# Patient Record
Sex: Male | Born: 1935 | Race: Black or African American | Hispanic: No | Marital: Married | Smoking: Former smoker
Health system: Southern US, Community
[De-identification: ages and names within clinical notes are randomized; demographics above are authoritative.]

## PROBLEM LIST (undated history)

## (undated) DIAGNOSIS — I639 Cerebral infarction, unspecified: Secondary | ICD-10-CM

## (undated) DIAGNOSIS — H269 Unspecified cataract: Secondary | ICD-10-CM

## (undated) DIAGNOSIS — I10 Essential (primary) hypertension: Secondary | ICD-10-CM

## (undated) HISTORY — DX: Essential (primary) hypertension: I10

## (undated) HISTORY — DX: Unspecified cataract: H26.9

## (undated) HISTORY — DX: Cerebral infarction, unspecified: I63.9

## (undated) HISTORY — PX: EYE SURGERY: SHX253

---

## 2012-04-29 ENCOUNTER — Ambulatory Visit: Payer: Self-pay | Admitting: Family Medicine

## 2012-04-29 VITALS — BP 160/68 | HR 67 | Temp 97.2°F | Resp 18 | Ht 71.5 in | Wt 246.2 lb

## 2012-04-29 DIAGNOSIS — I10 Essential (primary) hypertension: Secondary | ICD-10-CM

## 2012-04-29 DIAGNOSIS — I509 Heart failure, unspecified: Secondary | ICD-10-CM

## 2012-04-29 LAB — COMPREHENSIVE METABOLIC PANEL
Albumin: 4.1 g/dL (ref 3.5–5.2)
Alkaline Phosphatase: 75 U/L (ref 39–117)
BUN: 10 mg/dL (ref 6–23)
CO2: 30 mEq/L (ref 19–32)
Calcium: 8.8 mg/dL (ref 8.4–10.5)
Chloride: 103 mEq/L (ref 96–112)
Glucose, Bld: 153 mg/dL — ABNORMAL HIGH (ref 70–99)
Potassium: 4 mEq/L (ref 3.5–5.3)
Sodium: 140 mEq/L (ref 135–145)
Total Protein: 6.9 g/dL (ref 6.0–8.3)

## 2012-04-29 MED ORDER — FUROSEMIDE 40 MG PO TABS: 40.0000 mg | ORAL_TABLET | Freq: Two times a day (BID) | ORAL | Status: DC

## 2012-04-29 MED ORDER — DIGOXIN 125 MCG PO TABS: 125.0000 ug | ORAL_TABLET | Freq: Every day | ORAL | Status: DC

## 2012-04-29 MED ORDER — SPIRONOLACTONE 25 MG PO TABS: 25.0000 mg | ORAL_TABLET | Freq: Every day | ORAL | Status: DC

## 2012-04-29 MED ORDER — LOSARTAN POTASSIUM 50 MG PO TABS: 50.0000 mg | ORAL_TABLET | Freq: Every day | ORAL | Status: DC

## 2012-04-29 MED ORDER — CARVEDILOL 12.5 MG PO TABS: 12.5000 mg | ORAL_TABLET | Freq: Two times a day (BID) | ORAL | Status: DC

## 2012-04-29 NOTE — Progress Notes (Signed)
  Subjective:    Patient ID: Barry Owens, male    DOB: 04/13/1931, 78 y.o.   MRN: [id number]  HPI 78 yo male, new patient, here for medication refills.  Also states has edema in his feet.  He is here visiting daughter from [place].  Here until August.  Runs out of all his medications today.  Arrived in February.  Had 3 month supply from doctor in [place].   Has always had swelling in feet.  Takes Lasix for this.  Comes and goes.     Review of Systems Negative except as per HPI     Objective:   Physical Exam  Constitutional: He appears well-developed and well-nourished.  Cardiovascular: Normal rate, regular rhythm, normal heart sounds and intact distal pulses.   No murmur heard. Pulmonary/Chest: Effort normal and breath sounds normal.  Neurological: He is alert.  Skin: Skin is warm and dry.   2+ edema in feet and ankles       Assessment & Plan:  CHF and HTN - Appears stable here.  States hadn't had bloodwork checked in awhile.  Will check dig level and CMET.

## 2015-03-22 ENCOUNTER — Other Ambulatory Visit: Payer: Self-pay | Admitting: Family Medicine

## 2015-03-22 ENCOUNTER — Ambulatory Visit (INDEPENDENT_AMBULATORY_CARE_PROVIDER_SITE_OTHER): Payer: Self-pay

## 2015-03-22 ENCOUNTER — Ambulatory Visit (INDEPENDENT_AMBULATORY_CARE_PROVIDER_SITE_OTHER): Payer: Self-pay | Admitting: Family Medicine

## 2015-03-22 VITALS — BP 150/70 | HR 70 | Temp 98.2°F | Resp 18 | Ht 71.0 in | Wt 228.2 lb

## 2015-03-22 DIAGNOSIS — M25561 Pain in right knee: Secondary | ICD-10-CM

## 2015-03-22 DIAGNOSIS — R21 Rash and other nonspecific skin eruption: Secondary | ICD-10-CM

## 2015-03-22 DIAGNOSIS — R600 Localized edema: Secondary | ICD-10-CM

## 2015-03-22 DIAGNOSIS — M25562 Pain in left knee: Secondary | ICD-10-CM

## 2015-03-22 MED ORDER — FUROSEMIDE 20 MG PO TABS: 20.0000 mg | ORAL_TABLET | Freq: Every day | ORAL | Status: DC

## 2015-03-22 MED ORDER — CLOTRIMAZOLE-BETAMETHASONE 1-0.05 % EX CREA: 1.0000 "application " | TOPICAL_CREAM | Freq: Two times a day (BID) | CUTANEOUS | Status: DC

## 2015-03-22 NOTE — Progress Notes (Addendum)
Patient ID: Barry Owens, male   DOB: 12/05/1931, 81 y.o.   MRN: [id number]   This chart was scribed for Elvina SidleKurt Lauenstein, MD by [facility] Abu Hashem, medical scribe at [facility].The patient was seen in exam room 02 and the patient's care was started at 3:13 PM.  Patient ID: Barry Owens MRN: [id number], DOB: 12/05/1931, 81 y.o. Date of Encounter: 03/22/2015  Primary Physician: Tally DueGUEST, CHRIS WARREN, MD  Chief Complaint:  Chief Complaint  Patient presents with   Knee Pain    bilateral knee pain worse x 2-3 months. (chronic x 5 years)   HPI:  Barry Owens is a [age] y.o. male who presents to [facility] complaining of chronic bilateral knee pain for 5 years. Over the past 2-3 months his right knee has gradually worsening. Pain worsens when sitting down and walking. Pt wakes with an abnormal gait due to his knee pain. Both legs are swollen. He denies a rash.  He also has rash on his right thigh for one year now.  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 He is visiting his daughter.   No past medical history on file.   Home Meds: Prior to Admission medications   Not on File   Allergies: No Known Allergies  History   Social History   Marital Status: Married    Spouse Name: N/A   Number of Children: N/A   Years of Education: N/A   Occupational History   Not on file.   Social History Main Topics   Smoking status: Former Smoker    Types: Cigarettes    Quit date: 04/30/1987   Smokeless tobacco: Not on file   Alcohol Use: Not on file   Drug Use: Not on file   Sexual Activity: Not on file   Other Topics Concern   Not on file   Social History Narrative    Review of Systems: Constitutional: negative for chills, fever, night sweats, weight changes, or fatigue  HEENT: negative for vision changes, hearing loss, congestion, rhinorrhea, ST, epistaxis, or sinus pressure Cardiovascular: negative for chest  pain or palpitations,  Respiratory: negative for hemoptysis, wheezing, shortness of breath, or cough Abdominal: negative for abdominal pain, nausea, vomiting, diarrhea, or constipation Dermatological: Positive for a rash Msk: Positive for arthralgias, leg swelling. Neurologic: negative for headache, dizziness, or syncope All other systems reviewed and are otherwise negative with the exception to those above and in the HPI.  Physical Exam: Blood pressure 150/70, pulse 70, temperature 98.2 F (36.8 C), temperature source Oral, height 5\' 11"  (1.803 m), weight 228 lb 4 oz (103.534 kg), SpO2 99 %., Body mass index is 31.85 kg/(m^2). General: Well developed, well nourished, in no acute distress. Head: Normocephalic, atraumatic, eyes without discharge, sclera non-icteric, nares are without discharge. Bilateral auditory canals clear, TM's are without perforation, pearly grey and translucent with reflective cone of light bilaterally. Oral cavity moist, posterior pharynx without exudate, erythema, peritonsillar abscess, or post nasal drip.  Neck: Supple. No thyromegaly. Full ROM. No lymphadenopathy. Lungs: Clear bilaterally to auscultation without wheezes, rales, or rhonchi. Breathing is unlabored. Heart: RRR with S1 S2. No murmurs, rubs, or gallops appreciated. Abdomen: Soft, non-tender, non-distended with normoactive bowel sounds. No hepatomegaly. No rebound/guarding. No obvious abdominal masses. Msk:  Strength and tone normal for age. Right knee is slightly tender with no effusion that has crepitus.  Extremities/Skin: Warm and dry. No clubbing or cyanosis. +3 edema  in the feet, ankles and 2/3rd up his legs. About a 15 cm hyperpigmented scaly, thickened on the right anterior with circumferential scaling. Neuro: Alert and oriented X 3. Moves all extremities spontaneously. Gait is normal. CNII-XII grossly in tact. Psych:  Responds to questions appropriately with a normal affect.  After sterile prep and  ethyl chloride anesthesia, the lateral joint line approach to the knee was used to inject 80 mg of Depo-Medrol and 1 mL of Marcaine into the joint cavity. No complications and patient experienced significant relief after the injection.   [facility] reading (PRIMARY) by  Dr. Milus Glazier right knee film shows linear calcification in the medial meniscus and degenerative changes throughout the knee..    ASSESSMENT AND PLAN:  81 y.o. year old male with rash on right thigh which is typical for tinea corporis, persistent knee pain which has been going on for months and getting worse associated with crepitus, and pedal edema with postphlebitic changes of his Lasix (hyperpigmentation).  Patient may return for follow-up injection and the other knee depending on how long this lasts for the right knee.  This chart was scribed in my presence and reviewed by me personally.    ICD-9-CM ICD-10-CM   1. Knee pain, right 719.46 M25.561 CANCELED: DG Knee Complete 4 Views Right  2. Pedal edema 782.3 R60.0 furosemide (LASIX) 20 MG tablet  3. Rash and nonspecific skin eruption 782.1 R21 clotrimazole-betamethasone (LOTRISONE) cream  4. Knee pain, acute, left 719.46 M25.562      Signed, Elvina Sidle, MD    Signed, Elvina Sidle, MD 03/22/2015 3:11 PM

## 2015-03-22 NOTE — Patient Instructions (Signed)
Pseudogout Pseudogout is similar to gout. It is an arthritis that causes pain, swelling, and inflammation in a joint. This is due to the presence of calcium pyrophosphate crystals in the joint fluid. This is a different type of crystal than the crystals that cause gout. The joint pain can be severe and may last for days. In some cases, it may last much longer and can mimic rheumatoid arthritis or osteoarthritis. CAUSES  The exact cause of the disease is not known. It develops when crystals of calcium pyrophosphate build up in a joint. These crystals cause inflammation that leads to pain and swelling of the joint.  Chances of developing pseudogout increase with age. It often follows a minor injury.  The condition may be passed down from parent to child (hereditary).  Events such as strokes, heart attacks, or surgery may increase the risk of pseudogout.  Pseudogout can be associated with other disorders (hemophilia, ochronosis, amyloidosis, or hormonal disorders).  Pseudogout can be associated with dehydration, especially following surgery or hospitalization. Patients with known pseudogout should stay well hydrated before and after surgery. SYMPTOMS   Intense, constant pain in one joint that seems to come on for no reason.  The joint area may be hot to the touch, red, swollen, and stiff.  Pain may last from several days to a few weeks. It may then disappear. Later, it may start again, possibly in a different joint.  Pseudogout usually affects the knees. It can also affect the wrists, elbows, shoulders, and ankles. DIAGNOSIS  The diagnosis is often suggested by your exam or is suspected when an abnormal buildup of calcium salts (calcifications) are seen in the cartilage of joints on X-rays. The final diagnosis is made when fluid from the joint is examined under a special microscope used to find calcium pyrophosphate crystals. The crystals of pseudogout and gout may both be present at the same  time. TREATMENT  Nonsteroidal anti-inflammatory drugs (NSAIDs), such as naproxen, treat the pain. Identifying the trigger of pseudogout and treating the underlying cause, such as dehydration, is also important. There is no way to remove the crystals themselves. HOME CARE INSTRUCTIONS   Put ice on the sore joint.  Put ice in a plastic bag.  Place a towel between your skin and the bag.  Leave the ice on for 15-20 minutes at a time, 03-04 times a day, for the first 2 days.  Keep your affected joints raised (elevated) when possible to lessen swelling.  Use crutches (non-weight bearing) as needed. Walk without crutches as the pain allows, or as instructed. Gradually, start bearing weight.  Only take over-the-counter or prescription medicines for pain, discomfort, or fever as directed by your caregiver.  Once you recover from the painful attack, exercise regularly to keep your muscle strength. Not using a sore joint will cause the muscles around it to become weak. This may increase pain. Low-impact exercises, such as swimming, bicycling, water aerobics, and walking, may be best. This will give you energy, strengthen your heart, help you control your weight, and improve your well-being.  Maintain a healthy weight so your joints do not need to bear more weight than necessary. SEEK MEDICAL CARE IF:   You have an increase in joint pain not relieved with medicine.  You have a fever.  You have more serious symptoms such as skin rash, diarrhea, vomiting, headache, or other joint pains. FOR MORE INFORMATION  Arthritis Foundation: www.arthritis.org National Institute of Arthritis and Musculoskeletal and Skin Diseases: www.niams.nih.gov Document Released: 08/14/2004   Document Revised: 02/14/2012 Document Reviewed: 03/06/2010 Exit[employer] Patient Information 2015 [employer]. This information is not intended to replace advice given to you by your health care provider. Make sure you discuss any  questions you have with your health care provider.  

## 2015-08-16 ENCOUNTER — Emergency Department (HOSPITAL_COMMUNITY): Payer: Medicaid Other

## 2015-08-16 ENCOUNTER — Encounter (HOSPITAL_COMMUNITY): Payer: Self-pay | Admitting: *Deleted

## 2015-08-16 ENCOUNTER — Inpatient Hospital Stay (HOSPITAL_COMMUNITY)
Admission: EM | Admit: 2015-08-16 | Discharge: 2015-08-19 | DRG: 066 | Disposition: A | Discharge status: Home Health | Payer: Medicaid Other | Attending: Neurology | Admitting: Neurology

## 2015-08-16 DIAGNOSIS — F0390 Unspecified dementia without behavioral disturbance: Secondary | ICD-10-CM | POA: Diagnosis present

## 2015-08-16 DIAGNOSIS — R4182 Altered mental status, unspecified: Secondary | ICD-10-CM | POA: Diagnosis present

## 2015-08-16 DIAGNOSIS — I639 Cerebral infarction, unspecified: Secondary | ICD-10-CM

## 2015-08-16 DIAGNOSIS — E785 Hyperlipidemia, unspecified: Secondary | ICD-10-CM | POA: Diagnosis present

## 2015-08-16 DIAGNOSIS — H5347 Heteronymous bilateral field defects: Secondary | ICD-10-CM | POA: Diagnosis present

## 2015-08-16 DIAGNOSIS — I619 Nontraumatic intracerebral hemorrhage, unspecified: Secondary | ICD-10-CM | POA: Diagnosis present

## 2015-08-16 DIAGNOSIS — I10 Essential (primary) hypertension: Secondary | ICD-10-CM | POA: Diagnosis present

## 2015-08-16 DIAGNOSIS — I611 Nontraumatic intracerebral hemorrhage in hemisphere, cortical: Principal | ICD-10-CM | POA: Diagnosis present

## 2015-08-16 DIAGNOSIS — Z8249 Family history of ischemic heart disease and other diseases of the circulatory system: Secondary | ICD-10-CM | POA: Diagnosis not present

## 2015-08-16 DIAGNOSIS — Z87891 Personal history of nicotine dependence: Secondary | ICD-10-CM

## 2015-08-16 LAB — COMPREHENSIVE METABOLIC PANEL
ALK PHOS: 57 U/L (ref 38–126)
ALT: 13 U/L — ABNORMAL LOW (ref 17–63)
ANION GAP: 8 (ref 5–15)
AST: 34 U/L (ref 15–41)
Albumin: 3.8 g/dL (ref 3.5–5.0)
BILIRUBIN TOTAL: 1.2 mg/dL (ref 0.3–1.2)
BUN: 10 mg/dL (ref 6–20)
CALCIUM: 8.7 mg/dL — AB (ref 8.9–10.3)
CO2: 23 mmol/L (ref 22–32)
CREATININE: 1.27 mg/dL — AB (ref 0.61–1.24)
Chloride: 106 mmol/L (ref 101–111)
GFR, EST AFRICAN AMERICAN: 59 mL/min — AB (ref >60)
GFR, EST NON AFRICAN AMERICAN: 51 mL/min — AB (ref >60)
Glucose, Bld: 135 mg/dL — ABNORMAL HIGH (ref 65–99)
Potassium: 4 mmol/L (ref 3.5–5.1)
SODIUM: 137 mmol/L (ref 135–145)
TOTAL PROTEIN: 7.1 g/dL (ref 6.5–8.1)

## 2015-08-16 LAB — CBC
HCT: 40.5 % (ref 39.0–52.0)
HEMOGLOBIN: 13.1 g/dL (ref 13.0–17.0)
MCH: 27.1 pg (ref 26.0–34.0)
MCHC: 32.3 g/dL (ref 30.0–36.0)
MCV: 83.9 fL (ref 78.0–100.0)
PLATELETS: 188 10*3/uL (ref 150–400)
RBC: 4.83 MIL/uL (ref 4.22–5.81)
RDW: 14 % (ref 11.5–15.5)
WBC: 5.1 10*3/uL (ref 4.0–10.5)

## 2015-08-16 LAB — CBG MONITORING, ED
GLUCOSE-CAPILLARY: 95 mg/dL (ref 65–99)
Glucose-Capillary: 174 mg/dL — ABNORMAL HIGH (ref 65–99)

## 2015-08-16 LAB — MRSA PCR SCREENING: MRSA by PCR: NEGATIVE

## 2015-08-16 MED ORDER — STROKE: EARLY STAGES OF RECOVERY BOOK
Freq: Once | Status: AC
  Administered 2015-08-16: 21:00:00
  Filled 2015-08-16: qty 1

## 2015-08-16 MED ORDER — SENNOSIDES-DOCUSATE SODIUM 8.6-50 MG PO TABS
1.0000 | ORAL_TABLET | Freq: Two times a day (BID) | ORAL | Status: DC
  Administered 2015-08-16 – 2015-08-19 (×5): 1 via ORAL
  Filled 2015-08-16 (×7): qty 1

## 2015-08-16 MED ORDER — ACETAMINOPHEN 325 MG PO TABS
650.0000 mg | ORAL_TABLET | ORAL | Status: DC | PRN
  Administered 2015-08-19: 650 mg via ORAL
  Filled 2015-08-16: qty 2

## 2015-08-16 MED ORDER — STROKE: EARLY STAGES OF RECOVERY BOOK
Freq: Once | Status: AC
  Administered 2015-08-16: 22:00:00
  Filled 2015-08-16: qty 1

## 2015-08-16 MED ORDER — SODIUM CHLORIDE 0.9 % IV BOLUS (SEPSIS)
1000.0000 mL | Freq: Once | INTRAVENOUS | Status: AC
  Administered 2015-08-16: 1000 mL via INTRAVENOUS

## 2015-08-16 MED ORDER — LABETALOL HCL 5 MG/ML IV SOLN
10.0000 mg | INTRAVENOUS | Status: DC | PRN
  Filled 2015-08-16: qty 4

## 2015-08-16 MED ORDER — NICARDIPINE HCL IN NACL 20-0.86 MG/200ML-% IV SOLN
3.0000 mg/h | INTRAVENOUS | Status: DC
  Administered 2015-08-16 – 2015-08-17 (×3): 5 mg/h via INTRAVENOUS
  Administered 2015-08-17: 7.5 mg/h via INTRAVENOUS
  Filled 2015-08-16 (×5): qty 200

## 2015-08-16 MED ORDER — INFLUENZA VAC SPLIT QUAD 0.5 ML IM SUSY
0.5000 mL | PREFILLED_SYRINGE | INTRAMUSCULAR | Status: AC
  Administered 2015-08-17: 0.5 mL via INTRAMUSCULAR
  Filled 2015-08-16: qty 0.5

## 2015-08-16 MED ORDER — ACETAMINOPHEN 650 MG RE SUPP: 650.0000 mg | RECTAL | Status: DC | PRN

## 2015-08-16 MED ORDER — PANTOPRAZOLE SODIUM 40 MG IV SOLR
40.0000 mg | Freq: Every day | INTRAVENOUS | Status: DC
  Administered 2015-08-16: 40 mg via INTRAVENOUS
  Filled 2015-08-16 (×2): qty 40

## 2015-08-16 MED ORDER — NICARDIPINE HCL IN NACL 20-0.86 MG/200ML-% IV SOLN
3.0000 mg/h | Freq: Once | INTRAVENOUS | Status: AC
  Administered 2015-08-16: 5 mg/h via INTRAVENOUS
  Filled 2015-08-16: qty 200

## 2015-08-16 NOTE — ED Notes (Addendum)
Per family, pt has been getting progressively more altered. Woke up Tuesday morning and reported blurred vision and unsteady gait. Language barrier but pt is following commands and grips are equal at triage. Pt also has mass to left side of back, family reports pt having it for extended amount of time.

## 2015-08-16 NOTE — ED Provider Notes (Signed)
CSN: [id number]     Arrival date & time 08/16/15  1139 History   First MD Initiated Contact with Patient 08/16/15 1611     Chief Complaint  Patient presents with  . Altered Mental Status   Level V Caveat: Altered mental status   Barry Owens is a 81 y.o. male with a history of peripheral edema who presents to the ED with his daughter who reports that over the past 5 days he has been more confused. Patient's daughter reports that for the past several months the patient has had some forgetfulness however the past 5 days he has been confused and unable to know where he is. She reports that 5 days ago he was not acting himself and did not know where he was. She reports that he stated that he couldn't see anything. She reports that he wasn't eating well earlier in the week but he is back to eating normally recently. She reports he is able to get around on his own without assistance. She reports an area of swelling to his left back that has been present for years and he has no complaints about it. The patient complains of some bilateral low back pain and bilateral knee pain that the daughter reports is chronic. He has not been bowel or bladder incontinent. He denies fevers, chest pain, shortness of breath, palpitations, hematochezia, coughing, vomiting, diarrhea, abdominal pain or rashes. Family denies any known head trauma.   (Consider location/radiation/quality/duration/timing/severity/associated sxs/prior Treatment) HPI  History reviewed. No pertinent past medical history. Past Surgical History  Procedure Laterality Date  . Eye surgery     Family History  Problem Relation Age of Onset  . Hypertension Mother    Social History  Substance Use Topics  . Smoking status: Former Smoker    Types: Cigarettes    Quit date: 04/30/1987  . Smokeless tobacco: None  . Alcohol Use: None    Review of Systems  Unable to perform ROS: Mental status change  Respiratory: Negative for cough and  shortness of breath.   Cardiovascular: Negative for chest pain.  Gastrointestinal: Negative for nausea, vomiting, abdominal pain, diarrhea and blood in stool.  Genitourinary: Negative for dysuria and difficulty urinating.  Musculoskeletal: Positive for back pain. Negative for gait problem and neck pain.  Skin: Negative for rash.  Neurological: Positive for headaches.      Allergies  Review of patient's allergies indicates no known allergies.  Home Medications   Prior to Admission medications   Medication Sig Start Date End Date Taking? Authorizing Provider  acetaminophen (TYLENOL) 650 MG CR tablet Take 1,300 mg by mouth every 8 (eight) hours as needed for pain.   Yes Historical Provider, MD  furosemide (LASIX) 20 MG tablet Take 1 tablet (20 mg total) by mouth daily. Patient taking differently: Take 20 mg by mouth daily as needed for fluid.  03/22/15  Yes Elvina Sidle, MD  Multiple Vitamin (MULTIVITAMIN) tablet Take 1 tablet by mouth daily.   Yes Historical Provider, MD  clotrimazole-betamethasone (LOTRISONE) cream Apply 1 application topically 2 (two) times daily. Patient not taking: Reported on 08/16/2015 03/22/15   Elvina Sidle, MD   BP 173/114 mmHg  Pulse 66  Temp(Src) 98.9 F (37.2 C) (Oral)  Resp 18  Wt 223 lb 12.8 oz (101.515 kg)  SpO2 96% Physical Exam  Constitutional: He appears well-developed and well-nourished. No distress.  Nontoxic appearing.  HENT:  Head: Normocephalic and atraumatic.  Right Ear: External ear normal.  Left Ear: External ear normal.  Mouth/Throat: Oropharynx is clear and moist.  Eyes: Conjunctivae and EOM are normal. Pupils are equal, round, and reactive to light. Right eye exhibits no discharge. Left eye exhibits no discharge.  Neck: Normal range of motion. Neck supple. No JVD present.  Cardiovascular: Normal rate, regular rhythm, normal heart sounds and intact distal pulses.  Exam reveals no gallop and no friction rub.   Bilateral radial  pulses are intact.  Pulmonary/Chest: Effort normal and breath sounds normal. No respiratory distress. He has no wheezes. He has no rales.  Lungs clear to auscultation bilaterally.  Abdominal: Soft. Bowel sounds are normal. He exhibits no distension. There is no tenderness. There is no guarding.  Abdomen is soft and nontender to palpation.  Musculoskeletal: He exhibits edema. He exhibits no tenderness.  Mild bilateral pedal edema. The patient and is able to ambulate in the room without assistance. Patient has good strength in his bilateral upper and lower extremities. There is an area of edema to his left mid back which is nontender to palpation and soft to touch. No overlying skin changes. No erythema or warmth.  Lymphadenopathy:    He has no cervical adenopathy.  Neurological: He is alert. No cranial nerve deficit. Coordination normal.  The patient is alert and oriented to person and place only. He has trouble following complex commands and has trouble completing a full neurological evaluation. He has no pronator drift. His cranial nerves are intact. His sensation is intact his bilateral upper and lower extremities. His speech is clear and coherent.  Skin: Skin is warm and dry. No rash noted. He is not diaphoretic. No erythema. No pallor.  Psychiatric: He has a normal mood and affect. His behavior is normal.  Nursing note and vitals reviewed.   ED Course  Procedures (including critical care time) Labs Review Labs Reviewed  COMPREHENSIVE METABOLIC PANEL - Abnormal; Notable for the following:    Glucose, Bld 135 (*)    Creatinine, Ser 1.27 (*)    Calcium 8.7 (*)    ALT 13 (*)    GFR calc non Af Amer 51 (*)    GFR calc Af Amer 59 (*)    All other components within normal limits  CBG MONITORING, ED - Abnormal; Notable for the following:    Glucose-Capillary 174 (*)    All other components within normal limits  CBC  URINALYSIS, ROUTINE W REFLEX MICROSCOPIC (NOT AT [facility])  VITAMIN B12   TSH  CBG MONITORING, ED    Imaging Review Ct Head Wo Contrast  08/16/2015   CLINICAL DATA:  Blurred vision with unsteady gait. Symptoms began, and have been worsening, for 5 days.  EXAM: CT HEAD WITHOUT CONTRAST  TECHNIQUE: Contiguous axial images were obtained from the base of the skull through the vertex without intravenous contrast.  COMPARISON:  None.  FINDINGS: There is an acute intracerebral hemorrhage in the LEFT occipital lobe measuring 31 x 25 mm cross-section with mild surrounding edema. No shift. No intraventricular extension. No subdural or subarachnoid extension. No other similar foci of hemorrhage. No underlying skull fracture.  No features suggestive of acute stroke. No extra-axial hematoma. Disproportionate enlargement of the lateral and third ventricles without corresponding cortical atrophy, suggesting normal pressure hydrocephalus.  No sinus or mastoid disease.  IMPRESSION: Acute LEFT occipital lobe intracerebral hemorrhage measuring 31 x 25 mm cross-section. Mild surrounding edema but no shift. Considerations include atypical lobar hypertensive bleed, cerebral amyloid angiopathy related hemorrhage, complication of illicit drug use or PRES, unrecognized trauma or coagulopathy, or  underlying vascular malformation or vascular tumor.  Disproportionate ventricular enlargement without cortical atrophy, query normal-pressure hydrocephalus.  Critical Value/emergent results were called by telephone at the time of interpretation on 08/16/2015 at 5:36 pm to Dr. Everlene Farrier , who verbally acknowledged these results.   Electronically Signed   By: Elsie Stain M.D.   On: 08/16/2015 17:40   I have personally reviewed and evaluated these images and lab results as part of my medical decision-making.   EKG Interpretation   Date/Time:  Saturday August 16 2015 12:26:03 EDT Ventricular Rate:  69 PR Interval:  152 QRS Duration: 112 QT Interval:  426 QTC Calculation: 456 R Axis:   -43 Text  Interpretation:  Normal sinus rhythm Left axis deviation Moderate  voltage criteria for LVH, may be normal variant Abnormal ECG No old  tracing to compare Confirmed by KNAPP  MD-J, JON ([id number]) on 08/16/2015  6:25:12 PM      Filed Vitals:   08/16/15 1900 08/16/15 1915 08/16/15 1930 08/16/15 1945  BP: 162/83 177/105 164/146 173/114  Pulse: 75 118  66  Temp:      TempSrc:      Resp: 12 22 18    Weight:      SpO2: 100% 82%  96%     MDM   Meds given in ED:  Medications   stroke: mapping our early stages of recovery book (not administered)  acetaminophen (TYLENOL) tablet 650 mg (not administered)    Or  acetaminophen (TYLENOL) suppository 650 mg (not administered)  senna-docusate (Senokot-S) tablet 1 tablet (not administered)  labetalol (NORMODYNE,TRANDATE) injection 10-40 mg (not administered)  pantoprazole (PROTONIX) injection 40 mg (not administered)  sodium chloride 0.9 % bolus 1,000 mL (1,000 mLs Intravenous New Bag/Given 08/16/15 1831)  nicardipine (CARDENE) 20mg  in 0.86% saline IV infusion (0.1 mg/ml) (5 mg/hr Intravenous New Bag/Given 08/16/15 1832)    New Prescriptions   No medications on file    Final diagnoses:  Nontraumatic intracerebral hemorrhage, unspecified cerebral location  Altered mental status, unspecified altered mental status type   This is a 81 y.o. male with a history of peripheral edema who presents to the ED with his daughter who reports that over the past 5 days he has been more confused. Patient's daughter reports that for the past several months the patient has had some forgetfulness however the past 5 days he has been confused and unable to know where he is. She reports that 5 days ago he was not acting himself and did not know where he was. She reports that he stated that he couldn't see anything. Patient had not complained of any headaches, vomiting, nausea and abdominal pain. Family denies any head trauma.  On exam the patient is afebrile and  nontoxic-appearing. His initial blood pressure is 131/81 however he more hypertensive during the emergency department visit. Other vitals are stable. The patient is stable and resting comfortably in the room. The patient is oriented to person and place only and has trouble completing complex commands. He has trouble completing a complete neurological examination with me. What was completed she has no focal neurological deficits. The patient's CMP indicated elevated creatinine 1.27 with no baseline comparison. CBC is within normal limits. CT head without contrast indicating acute left subdural lobe intracerebral hemorrhage with no shift. Neuro hospice was consulted and Dr. Elspeth Cho plans to admit the patient. Dr. Linwood Dibbles started cardene to help manage with increasing blood pressure.  Patient admitted by neurology service.   This patient was  discussed with and evaluated by Dr. Linwood Dibbles who agrees with assessment and plan.  CRITICAL CARE Performed by: Lawana Chambers   Total critical care time: 35  Critical care time was exclusive of separately billable procedures and treating other patients.  Critical care was necessary to treat or prevent imminent or life-threatening deterioration.  Critical care was time spent personally by me on the following activities: development of treatment plan with patient and/or surrogate as well as nursing, discussions with consultants, evaluation of patient's response to treatment, examination of patient, obtaining history from patient or surrogate, ordering and performing treatments and interventions, ordering and review of laboratory studies, ordering and review of radiographic studies, pulse oximetry and re-evaluation of patient's condition.     Everlene Farrier, PA-C 08/16/15 2031

## 2015-08-16 NOTE — ED Provider Notes (Signed)
Medical screening examination/treatment/procedure(s) were conducted as a shared visit with non-physician practitioner(s) and myself.  I personally evaluated the patient during the encounter.    Patient presents to the emergency room with his family member because of increasing trouble with blurred vision, unsteady gait and some confusion since Tuesday morning. Patient has not had any complaints of headache. No nausea or vomiting. Physical Exam  BP 158/79 mmHg  Pulse 64  Temp(Src) 98.9 F (37.2 C) (Oral)  Resp 20  Wt 223 lb 12.8 oz (101.515 kg)  SpO2 100%  Physical Exam  Constitutional: He appears well-developed and well-nourished. No distress.  HENT:  Head: Normocephalic and atraumatic.  Right Ear: External ear normal.  Left Ear: External ear normal.  Eyes: Conjunctivae are normal. Right eye exhibits no discharge. Left eye exhibits no discharge. No scleral icterus.  Neck: Neck supple. No tracheal deviation present.  Cardiovascular: Normal rate.   Pulmonary/Chest: Effort normal. No stridor. No respiratory distress.  Musculoskeletal: He exhibits no edema.  Neurological: He is alert. Cranial nerve deficit: no gross deficits.  Skin: Skin is warm and dry. No rash noted.  Psychiatric: He has a normal mood and affect.  Nursing note and vitals reviewed.   ED Course  Procedures  EKG Interpretation  Date/Time:  Saturday August 16 2015 12:26:03 EDT Ventricular Rate:  69 PR Interval:  152 QRS Duration: 112 QT Interval:  426 QTC Calculation: 456 R Axis:   -43 Text Interpretation:  Normal sinus rhythm Left axis deviation Moderate voltage criteria for LVH, may be normal variant Abnormal ECG No old tracing to compare Confirmed by Shamariah Shewmake  MD-J, Carlotta Telfair ([id number]) on 08/16/2015 6:25:12 PM      CRITICAL CARE Performed by: UEAVW,UJW Total critical care time: 35 Critical care time was exclusive of separately billable procedures and treating other patients. Critical care was necessary to treat  or prevent imminent or life-threatening deterioration. Critical care was time spent personally by me on the following activities: development of treatment plan with patient and/or surrogate as well as nursing, discussions with consultants, evaluation of patient's response to treatment, examination of patient, obtaining history from patient or surrogate, ordering and performing treatments and interventions, ordering and review of laboratory studies, ordering and review of radiographic studies, pulse oximetry and re-evaluation of patient's condition.  MDM Pt has an acute left occipital hemorrhage on head CT.  This correlates with the confusion, and visual disturbances he has been having.  Pt is hypertensive in the ED.  Will start a cardene drip to control his blood pressure.  Consult with neurology.  Pt remains, alert, following commands in no distress.  GCS 14.        Linwood Dibbles, MD 08/16/15 [phone]

## 2015-08-16 NOTE — H&P (Addendum)
Stroke Consult    Chief Complaint: altered mental status HPI: Barry Owens is an 81 y.o. male hx of HTN presenting to the ED with 5 days of confusion. Daughter notes baseline confusion for the past few months but notes it has been markedly worse the past 5 days. He reports difficulty with vision though notes this has improved. He remains confused. Denies any noted weakness or speech deficits. No recent trauma. He denies being on any anticoagulation.  CT head imaging reviewed, shows left occipital lobe ICH. BP upon presentation to ED was 131/81.   Date last known well: 08/11/2015 Time last known well: unclear tPA Given: no, ICH Modified Rankin: Rankin Score=1  History reviewed. No pertinent past medical history.  Past Surgical History  Procedure Laterality Date  . Eye surgery      Family History  Problem Relation Age of Onset  . Hypertension Mother    Social History:  reports that he quit smoking about 28 years ago. His smoking use included Cigarettes. He does not have any smokeless tobacco history on file. His alcohol and drug histories are not on file.  Allergies: No Known Allergies   (Not in a hospital admission)  ROS: Out of a complete 14 system review, the patient complains of only the following symptoms, and all other reviewed systems are negative. +confusion, headache  Physical Examination: Filed Vitals:   08/16/15 1800  BP: 188/143  Pulse:   Temp:   Resp: 14   Physical Exam  Constitutional: He appears well-developed and well-nourished.  Psych: Affect appropriate to situation Eyes: No scleral injection HENT: No OP obstrucion Head: Normocephalic.  Cardiovascular: Normal rate and regular rhythm.  Respiratory: Effort normal and breath sounds normal.  GI: Soft. Bowel sounds are normal. No distension. There is no tenderness.  Skin: WDI  Neurologic Examination: Mental Status: Alert, oriented to name only, gets confused, goes off on tangents. No signs  of aphasia. No dysarthria. Difficulty following commands Cranial Nerves: II: unable to visualize optic discs, decreased blink to threat on right, pupils equal, round, reactive to light  III,IV, VI: ptosis not present, extra-ocular motions intact bilaterally V,VII: smile symmetric, facial light touch sensation normal bilaterally VIII: hearing normal bilaterally IX,X: gag reflex present XI: trapezius strength/neck flexion strength normal bilaterally XII: tongue strength normal  Motor: Not following commands but appears to move all extremities against light resistance  Tone and bulk:normal tone throughout; no atrophy noted Sensory: Pinprick and light touch intact throughout, bilaterally Deep Tendon Reflexes: 1+ and symmetric throughout Plantars: Right: downgoing   Left: downgoing Cerebellar: Not following commands Gait: deferred  Laboratory Studies:   Basic Metabolic Panel:  Recent Labs Lab 08/16/15 1244  NA 137  K 4.0  CL 106  CO2 23  GLUCOSE 135*  BUN 10  CREATININE 1.27*  CALCIUM 8.7*    Liver Function Tests:  Recent Labs Lab 08/16/15 1244  AST 34  ALT 13*  ALKPHOS 57  BILITOT 1.2  PROT 7.1  ALBUMIN 3.8   No results for input(s): LIPASE, AMYLASE in the last 168 hours. No results for input(s): AMMONIA in the last 168 hours.  CBC:  Recent Labs Lab 08/16/15 1244  WBC 5.1  HGB 13.1  HCT 40.5  MCV 83.9  PLT 188    Cardiac Enzymes: No results for input(s): CKTOTAL, CKMB, CKMBINDEX, TROPONINI in the last 168 hours.  BNP: Invalid input(s): POCBNP  CBG:  Recent Labs Lab 08/16/15 1223 08/16/15 1734  GLUCAP 174* 95    Microbiology:  No results found for this or any previous visit.  Coagulation Studies: No results for input(s): LABPROT, INR in the last 72 hours.  Urinalysis: No results for input(s): COLORURINE, LABSPEC, PHURINE, GLUCOSEU, HGBUR, BILIRUBINUR, KETONESUR, PROTEINUR, UROBILINOGEN, NITRITE, LEUKOCYTESUR in the last 168  hours.  Invalid input(s): APPERANCEUR  Lipid Panel:  No results found for: CHOL, TRIG, HDL, CHOLHDL, VLDL, LDLCALC  HgbA1C: No results found for: HGBA1C  Urine Drug Screen:  No results found for: LABOPIA, COCAINSCRNUR, LABBENZ, AMPHETMU, THCU, LABBARB  Alcohol Level: No results for input(s): ETH in the last 168 hours.  Other results:  Imaging: Ct Head Wo Contrast  08/16/2015   CLINICAL DATA:  Blurred vision with unsteady gait. Symptoms began, and have been worsening, for 5 days.  EXAM: CT HEAD WITHOUT CONTRAST  TECHNIQUE: Contiguous axial images were obtained from the base of the skull through the vertex without intravenous contrast.  COMPARISON:  None.  FINDINGS: There is an acute intracerebral hemorrhage in the LEFT occipital lobe measuring 31 x 25 mm cross-section with mild surrounding edema. No shift. No intraventricular extension. No subdural or subarachnoid extension. No other similar foci of hemorrhage. No underlying skull fracture.  No features suggestive of acute stroke. No extra-axial hematoma. Disproportionate enlargement of the lateral and third ventricles without corresponding cortical atrophy, suggesting normal pressure hydrocephalus.  No sinus or mastoid disease.  IMPRESSION: Acute LEFT occipital lobe intracerebral hemorrhage measuring 31 x 25 mm cross-section. Mild surrounding edema but no shift. Considerations include atypical lobar hypertensive bleed, cerebral amyloid angiopathy related hemorrhage, complication of illicit drug use or PRES, unrecognized trauma or coagulopathy, or underlying vascular malformation or vascular tumor.  Disproportionate ventricular enlargement without cortical atrophy, query normal-pressure hydrocephalus.  Critical Value/emergent results were called by telephone at the time of interpretation on 08/16/2015 at 5:36 pm to Dr. Everlene Farrier , who verbally acknowledged these results.   Electronically Signed   By: Elsie Stain M.D.   On: 08/16/2015 17:40     Assessment: 81 y.o. male hx of poorly controlled hypertension and possible dementia presenting with 5 days of confusion associated with acute onset of a headache visual deficit. Per family, headache and visual deficits have improved but he remains confused. CT head imaging reviewed and shows left occipital hemorrhage.   Plan:  1) Admit to ICU 2)MRI, MRA of the brain without contrast 3) no antiplatelets or anticoagulants 4) blood pressure control with goal systolic <160 5) Frequent neuro checks 6) If symptoms worsen or there is decreased mental status, repeat stat head CT 7) PT,OT,ST 8) Will check B12 and TSH for dementia workup    This patient is critically ill and at significant risk of neurological worsening, death and care requires constant monitoring of vital signs, hemodynamics,respiratory and cardiac monitoring,review of multiple databases, neurological assessment, discussion with family, other specialists and medical decision making of high complexity. I spent 45 inutes of neurocritical care time in the care of this patient.    Elspeth Cho, DO [facility] [phone]  If 7pm- 7am, please page neurology on call as listed in AMION. 08/16/2015, 6:31 PM

## 2015-08-17 ENCOUNTER — Inpatient Hospital Stay (HOSPITAL_COMMUNITY): Payer: Medicaid Other

## 2015-08-17 DIAGNOSIS — I610 Nontraumatic intracerebral hemorrhage in hemisphere, subcortical: Secondary | ICD-10-CM

## 2015-08-17 LAB — URINALYSIS, ROUTINE W REFLEX MICROSCOPIC
BILIRUBIN URINE: NEGATIVE
Glucose, UA: NEGATIVE mg/dL
KETONES UR: NEGATIVE mg/dL
NITRITE: NEGATIVE
PH: 5.5 (ref 5.0–8.0)
PROTEIN: NEGATIVE mg/dL
Specific Gravity, Urine: 1.017 (ref 1.005–1.030)
UROBILINOGEN UA: 2 mg/dL — AB (ref 0.0–1.0)

## 2015-08-17 LAB — URINE MICROSCOPIC-ADD ON

## 2015-08-17 LAB — TSH: TSH: 0.722 u[IU]/mL (ref 0.350–4.500)

## 2015-08-17 LAB — VITAMIN B12: Vitamin B-12: 371 pg/mL (ref 180–914)

## 2015-08-17 MED ORDER — METOPROLOL TARTRATE 25 MG PO TABS
25.0000 mg | ORAL_TABLET | Freq: Two times a day (BID) | ORAL | Status: DC
  Administered 2015-08-17: 25 mg via ORAL
  Filled 2015-08-17 (×3): qty 1

## 2015-08-17 MED ORDER — PANTOPRAZOLE SODIUM 40 MG PO TBEC
40.0000 mg | DELAYED_RELEASE_TABLET | Freq: Every day | ORAL | Status: DC
  Administered 2015-08-18 – 2015-08-19 (×2): 40 mg via ORAL
  Filled 2015-08-17 (×2): qty 1

## 2015-08-17 MED ORDER — QUETIAPINE FUMARATE 25 MG PO TABS
12.5000 mg | ORAL_TABLET | Freq: Two times a day (BID) | ORAL | Status: DC
  Administered 2015-08-17 – 2015-08-18 (×3): 12.5 mg via ORAL
  Filled 2015-08-17 (×6): qty 1

## 2015-08-17 MED ORDER — LORAZEPAM 2 MG/ML IJ SOLN
1.0000 mg | Freq: Once | INTRAMUSCULAR | Status: AC
  Administered 2015-08-17: 1 mg via INTRAVENOUS
  Filled 2015-08-17: qty 1

## 2015-08-17 NOTE — Progress Notes (Signed)
Stroke Team Progress Note  HISTORY  81 y.o. male hx of HTN presenting to the ED with 5 days of confusion. Daughter notes baseline confusion for the past few months but notes it has been markedly worse the past 5 days. He reports difficulty with vision though notes this has improved. He remains confused. Denies any noted weakness or speech deficits. No recent trauma. He denies being on any anticoagulation. L occipital ICH   SUBJECTIVE Pt is confused this AM. On nicardipine gtt.    OBJECTIVE Most recent Vital Signs: Filed Vitals:   08/17/15 0746 08/17/15 0830 08/17/15 0900 08/17/15 0930  BP:  144/55 140/51 147/53  Pulse:  72 70 70  Temp: 98.5 F (36.9 C)     TempSrc: Oral     Resp:  Height:      Weight:      SpO2:  100% 96% 97%   CBG (last 3)   Recent Labs  08/16/15 1223 08/16/15 1734  GLUCAP 174* 95    IV Fluid Intake:   . niCARDipine 7.5 mg/hr (08/17/15 0507)    MEDICATIONS  . Influenza vac split quadrivalent PF  0.5 mL Intramuscular Tomorrow-1000  . pantoprazole (PROTONIX) IV  40 mg Intravenous QHS  . senna-docusate  1 tablet Oral BID   PRN:  acetaminophen **OR** acetaminophen, labetalol  Diet:  Diet regular Room service appropriate?: Yes; Fluid consistency:: Thin  Activity:   Up with assistance DVT Prophylaxis:  SCDs  CLINICALLY SIGNIFICANT STUDIES Basic Metabolic Panel:  Recent Labs Lab 08/16/15 1244  NA 137  K 4.0  CL 106  CO2 23  GLUCOSE 135*  BUN 10  CREATININE 1.27*  CALCIUM 8.7*   Liver Function Tests:  Recent Labs Lab 08/16/15 1244  AST 34  ALT 13*  ALKPHOS 57  BILITOT 1.2  PROT 7.1  ALBUMIN 3.8   CBC:  Recent Labs Lab 08/16/15 1244  WBC 5.1  HGB 13.1  HCT 40.5  MCV 83.9  PLT 188   Coagulation: No results for input(s): LABPROT, INR in the last 168 hours. Cardiac Enzymes: No results for input(s): CKTOTAL, CKMB, CKMBINDEX, TROPONINI in the last 168 hours. Urinalysis: No results for input(s): COLORURINE, LABSPEC,  PHURINE, GLUCOSEU, HGBUR, BILIRUBINUR, KETONESUR, PROTEINUR, UROBILINOGEN, NITRITE, LEUKOCYTESUR in the last 168 hours.  Invalid input(s): APPERANCEUR Lipid PanelNo results found for: CHOL, TRIG, HDL, CHOLHDL, VLDL, LDLCALC HgbA1C No results found for: HGBA1C  Urine Drug Screen:  No results found for: LABOPIA, COCAINSCRNUR, LABBENZ, AMPHETMU, THCU, LABBARB  Alcohol Level: No results for input(s): ETH in the last 168 hours.  Ct Head Wo Contrast  08/16/2015   CLINICAL DATA:  Blurred vision with unsteady gait. Symptoms began, and have been worsening, for 5 days.  EXAM: CT HEAD WITHOUT CONTRAST  TECHNIQUE: Contiguous axial images were obtained from the base of the skull through the vertex without intravenous contrast.  COMPARISON:  None.  FINDINGS: There is an acute intracerebral hemorrhage in the LEFT occipital lobe measuring 31 x 25 mm cross-section with mild surrounding edema. No shift. No intraventricular extension. No subdural or subarachnoid extension. No other similar foci of hemorrhage. No underlying skull fracture.  No features suggestive of acute stroke. No extra-axial hematoma. Disproportionate enlargement of the lateral and third ventricles without corresponding cortical atrophy, suggesting normal pressure hydrocephalus.  No sinus or mastoid disease.  IMPRESSION: Acute LEFT occipital lobe intracerebral hemorrhage measuring 31 x 25 mm cross-section. Mild surrounding edema but no shift. Considerations include atypical lobar hypertensive bleed, cerebral  amyloid angiopathy related hemorrhage, complication of illicit drug use or PRES, unrecognized trauma or coagulopathy, or underlying vascular malformation or vascular tumor.  Disproportionate ventricular enlargement without cortical atrophy, query normal-pressure hydrocephalus.  Critical Value/emergent results were called by telephone at the time of interpretation on 08/16/2015 at 5:36 pm to Dr. Everlene Farrier , who verbally acknowledged these results.    Electronically Signed   By: Elsie Stain M.D.   On: 08/16/2015 17:40    CT of the brain  L occipital ICH   MRI of the brain  Pending   MRA of the brain  Pending  Carotid Doppler  Pending   2D Echocardiogram  Pending   CXR    EKG  normal EKG, normal sinus rhythm, unchanged from previous tracings. For complete results please see formal report.   Therapy Recommendations pending  Physical Exam  Neurologic Examination: Mental Status: Alert, oriented to name only, gets confused, goes off on tangents. No signs of aphasia. No dysarthria. Difficulty following commands Cranial Nerves: II: unable to visualize optic discs, decreased blink to threat on right, pupils equal, round, reactive to light  III,IV, VI: ptosis not present, extra-ocular motions intact bilaterally V,VII: smile symmetric, facial light touch sensation normal bilaterally VIII: hearing normal bilaterally IX,X: gag reflex present XI: trapezius strength/neck flexion strength normal bilaterally XII: tongue strength normal  Motor: Not following commands but appears to move all extremities against light resistance  Tone and bulk:normal tone throughout; no atrophy noted Sensory: Pinprick and light touch intact throughout, bilaterally Deep Tendon Reflexes: 1+ and symmetric throughout Plantars: Right: downgoingLeft: downgoing Cerebellar: Not following commands Gait: deferred  ASSESSMENT 81 y.o. male hx of poorly controlled hypertension and possible dementia presenting with 5 days of confusion associated with acute onset of a headache visual deficit. Per family, headache and visual deficits have improved but he remains confused. CT head imaging reviewed and shows left occipital hemorrhage.    Hospital day # 1  TREATMENT/PLAN  Awaiting for MRI/MRA to look for possibility of AVM vs amyloid angiopathy on GRE sequence of MRI  seroquel 12.5 BID PRN  On nicardipine gtt at 7.5. Started  metoprolol 25 BID  Goal SBP <160  Family at bedside Pt/OT SCDs  SIGNED    To contact Stroke Continuity provider, please refer to [email or web address]. After hours, contact General Neurology

## 2015-08-18 ENCOUNTER — Inpatient Hospital Stay (HOSPITAL_COMMUNITY): Payer: Medicaid Other

## 2015-08-18 DIAGNOSIS — I639 Cerebral infarction, unspecified: Secondary | ICD-10-CM

## 2015-08-18 DIAGNOSIS — I611 Nontraumatic intracerebral hemorrhage in hemisphere, cortical: Principal | ICD-10-CM

## 2015-08-18 DIAGNOSIS — F0390 Unspecified dementia without behavioral disturbance: Secondary | ICD-10-CM

## 2015-08-18 DIAGNOSIS — I10 Essential (primary) hypertension: Secondary | ICD-10-CM

## 2015-08-18 LAB — CBC
HEMATOCRIT: 42 % (ref 39.0–52.0)
Hemoglobin: 13.9 g/dL (ref 13.0–17.0)
MCH: 27.2 pg (ref 26.0–34.0)
MCHC: 33.1 g/dL (ref 30.0–36.0)
MCV: 82.2 fL (ref 78.0–100.0)
Platelets: 195 10*3/uL (ref 150–400)
RBC: 5.11 MIL/uL (ref 4.22–5.81)
RDW: 13.8 % (ref 11.5–15.5)
WBC: 4.6 10*3/uL (ref 4.0–10.5)

## 2015-08-18 LAB — LIPID PANEL
Cholesterol: 164 mg/dL (ref 0–200)
HDL: 44 mg/dL (ref >40)
LDL CALC: 109 mg/dL — AB (ref 0–99)
Total CHOL/HDL Ratio: 3.7 RATIO
Triglycerides: 56 mg/dL (ref <150)
VLDL: 11 mg/dL (ref 0–40)

## 2015-08-18 LAB — FOLATE: FOLATE: 14.6 ng/mL (ref >5.9)

## 2015-08-18 LAB — BASIC METABOLIC PANEL
ANION GAP: 7 (ref 5–15)
BUN: 6 mg/dL (ref 6–20)
CALCIUM: 8.8 mg/dL — AB (ref 8.9–10.3)
CO2: 28 mmol/L (ref 22–32)
CREATININE: 0.86 mg/dL (ref 0.61–1.24)
Chloride: 104 mmol/L (ref 101–111)
GFR calc Af Amer: >60 mL/min (ref >60)
GFR calc non Af Amer: >60 mL/min (ref >60)
GLUCOSE: 125 mg/dL — AB (ref 65–99)
Potassium: 3.6 mmol/L (ref 3.5–5.1)
SODIUM: 139 mmol/L (ref 135–145)

## 2015-08-18 MED ORDER — PRAVASTATIN SODIUM 20 MG PO TABS: 20.0000 mg | ORAL_TABLET | Freq: Every day | ORAL | Status: DC

## 2015-08-18 MED ORDER — LABETALOL HCL 5 MG/ML IV SOLN: 10.0000 mg | INTRAVENOUS | Status: DC | PRN

## 2015-08-18 MED ORDER — LISINOPRIL 20 MG PO TABS
20.0000 mg | ORAL_TABLET | Freq: Every day | ORAL | Status: DC
  Administered 2015-08-18: 20 mg via ORAL
  Filled 2015-08-18: qty 1

## 2015-08-18 MED ORDER — LISINOPRIL 20 MG PO TABS
20.0000 mg | ORAL_TABLET | Freq: Two times a day (BID) | ORAL | Status: DC
  Administered 2015-08-18 – 2015-08-19 (×2): 20 mg via ORAL
  Filled 2015-08-18 (×2): qty 1

## 2015-08-18 NOTE — Progress Notes (Signed)
Occupational Therapy Evaluation Patient Details Name: Barry Owens MRN: [id number] DOB: 02-25-1931 Today's Date: 08/18/2015    History of Present Illness 81 y.o. male hx of HTN presenting to the ED with 5 days of confusion and visual changes. CT + L occippital infarct and MRI + L parietal infarct.   Clinical Impression   PTA, pt independent with ADl and mobility and had 24/7 S. Family states that pt had demonstrated increased difficulty with memory over last several months, but able to function at mod I level.  Pt with apparent R field cut in addition to cognitive and visual perceptual deficits affecting his ability to function. Required +2 for safe ambulation with ataxic type gait pattern. At this time, recommend CIR consult. Will follow acutely to facilitate D/C to next venue to care.  Daughter discussing financial concerns/hospital bills/no ins with this therapist and stating that she is hopeful of D/C tomorrow. Discussed pt's functional deficits with family and explained that the pt is not safe to D/C home at this time.     Follow Up Recommendations  Supervision/Assistance - 24 hour;CIR    Equipment Recommendations  3 in 1 bedside comode    Recommendations for Other Services Rehab consult     Precautions / Restrictions Precautions Precautions: Fall      Mobility Bed Mobility Overal bed mobility: Needs Assistance Bed Mobility: Supine to Sit     Supine to sit: Min assist;+2 for safety/equipment     General bed mobility comments: Significantly increased time required to carry out task to come to EOB, increased verbal and tactil cues to initiate movement. Patient with poor ability to sustain attention throughout task.   Transfers Overall transfer level: Needs assistance Equipment used: 2 person hand held assist Transfers: Sit to/from Stand Sit to Stand: Min assist;+2 physical assistance         General transfer comment: Min assist to power up to standing with  bilateral support from therapists. Patient with inability to maintain balance in standing without physical assist. heavy reliance on hand held assist.     Balance Overall balance assessment: Needs assistance Sitting-balance support: Feet supported Sitting balance-Leahy Scale: Fair Sitting balance - Comments: in sitting EOB patient with posterior bias, unclear if this was related to sitting balance deficits, or patient attempting to reposition and return to bed   Standing balance support: Bilateral upper extremity supported;During functional activity Standing balance-Leahy Scale: Poor Standing balance comment: Bilateral UE support provided via hand held assist and use of gait belt with wrap around support. Patient with poor ability to maintain static standing, hips flexed despite cues for upright posture                            ADL Overall ADL's : Needs assistance/impaired     Grooming: Minimal assistance Grooming Details (indicate cue type and reason): difficulty with sequencing of ADL tasks. Misjudging distances when reaching. Cues  Upper Body Bathing: Minimal assitance   Lower Body Bathing: Moderate assistance;Sit to/from stand   Upper Body Dressing : Moderate assistance   Lower Body Dressing: Moderate assistance   Toilet Transfer: +2 for physical assistance;Minimal assistance Toilet Transfer Details (indicate cue type and reason): ?ataxic gait pattern         Functional mobility during ADLs: +2 for physical assistance;Moderate assistance;Cueing for sequencing;Cueing for safety (appeared to demonstrate ataxic type gait pattern) General ADL Comments: Difficulty with sequenicng during ADL tasks. Required hand over hand to put  toothpaste onto toothbrush. difficulty judging distance.     Vision Vision Assessment?: Vision impaired- to be further tested in functional context Additional Comments: Difficult to accurately assess due to apparent cogitive deficits. Unable  to follow commands for tracking. Appears to demonstrate R field cut. Family states he left food on the R side of his plate.Will further assess.    Perception Perception Comments: deficits   Praxis Praxis Praxis tested?: Deficits Deficits: Initiation;Perseveration Praxis-Other Comments: cues to terminate brushing teeth    Pertinent Vitals/Pain Pain Assessment: Faces Faces Pain Scale: Hurts a little bit Pain Location: chest/electrode Pain Descriptors / Indicators: Discomfort Pain Intervention(s): Limited activity within patient's tolerance     Hand Dominance     Extremity/Trunk Assessment Upper Extremity Assessment Upper Extremity Assessment: Overall WFL for tasks assessed   Lower Extremity Assessment Lower Extremity Assessment: Defer to PT evaluation RLE Coordination: decreased fine motor;decreased gross motor LLE Coordination: decreased fine motor;decreased gross motor   Cervical / Trunk Assessment Cervical / Trunk Assessment: Normal   Communication Communication Communication: Prefers language other than English;HOH (swahili)   Cognition Arousal/Alertness: Awake/alert Behavior During Therapy: Restless (pulling at leads) Overall Cognitive Status: Impaired/Different from baseline Area of Impairment: Orientation;Attention;Following commands;Safety/judgement;Awareness;Problem solving;Memory Orientation Level: Disoriented to;Place;Time Current Attention Level: Sustained Memory: Decreased short-term memory Following Commands: Follows one step commands with increased time Safety/Judgement: Decreased awareness of safety;Decreased awareness of deficits Awareness: Intellectual Problem Solving: Decreased initiation;Slow processing;Requires verbal cues;Requires tactile cues;Difficulty sequencing General Comments: Patient requires max cues to perform tasks, moments of decreased attention during session.    General Comments       Exercises       Shoulder Instructions       Home Living Family/patient expects to be discharged to:: Private residence Living Arrangements: Spouse/significant other;Children Available Help at Discharge: Family Type of Home: House Home Access: Stairs to enter Secretary/administrator of Steps: 2   Home Layout: Multi-level Alternate Level Stairs-Number of Steps: split level   Bathroom Shower/Tub: Tub/shower unit Shower/tub characteristics: Engineer, building services: Handicapped height     Home Equipment: None          Prior Functioning/Environment Level of Independence: Independent             OT Diagnosis: Generalized weakness;Cognitive deficits;Disturbance of vision;Ataxia   OT Problem List: Decreased activity tolerance;Impaired balance (sitting and/or standing);Impaired vision/perception;Decreased coordination;Decreased cognition;Decreased safety awareness;Decreased knowledge of use of DME or AE   OT Treatment/Interventions: Self-care/ADL training;Therapeutic exercise;DME and/or AE instruction;Therapeutic activities;Cognitive remediation/compensation;Visual/perceptual remediation/compensation;Patient/family education;Balance training    OT Goals(Current goals can be found in the care plan section) Acute Rehab OT Goals Patient Stated Goal: to go home OT Goal Formulation: With patient/family Time For Goal Achievement: 09/01/15 Potential to Achieve Goals: Good  OT Frequency: Min 3X/week   Barriers to D/C:            Co-evaluation   Reason for Co-Treatment: Complexity of the patient's impairments (multi-system involvement);Necessary to address cognition/behavior during functional activity;For patient/therapist safety PT goals addressed during session: Mobility/safety with mobility;Balance        End of Session Equipment Utilized During Treatment: Gait belt Nurse Communication: Mobility status  Activity Tolerance: Patient tolerated treatment well Patient left: in chair;with call bell/phone within  reach;with chair alarm set;with family/visitor present   Time: 1350-1430 OT Time Calculation (min): 40 min Charges:  OT General Charges $OT Visit: 1 Procedure OT Evaluation $Initial OT Evaluation Tier I: 1 Procedure OT Treatments $Self Care/Home Management : 8-22 mins G-Codes:    Heike Pounds,HILLARY Aug 26, 2015,  3:41 PM   [facility], OTR/L  [phone] 08/18/2015

## 2015-08-18 NOTE — Progress Notes (Signed)
Inpatient Rehabilitation  Patient was screened by Chantae Soo for appropriateness for an Inpatient Acute Rehab consult.  At this time, we are recommending Inpatient Rehab consult.  Please order when you feel appropriate.   Machai Desmith PT Inpatient Rehab Admissions Coordinator Cell [phone] Office [phone]   

## 2015-08-18 NOTE — Progress Notes (Signed)
Patient arrived around 1640 alert unable to access level of orientation due to aphasia, not complaining of pain family is with him telemetry is on and SCD's as well. Will continue to monitor.

## 2015-08-18 NOTE — Care Management Note (Signed)
Case Management Note  Patient Details  Name: TORRION WITTER MRN: [id number] Date of Birth: [birth year]  Subjective/Objective:   Pt admitted on 08/16/15 with Lt occipital hemorrhage.  PTA, pt independent, resides at home with family.            Action/Plan: Will follow for discharge planning.  PT/OT evals pending.    Expected Discharge Date:                  Expected Discharge Plan:  Home w Home Health Services  In-House Referral:     Discharge planning Services  CM Consult  Post Acute Care Choice:    Choice offered to:     DME Arranged:    DME Agency:     HH Arranged:    HH Agency:     Status of Service:  In process, will continue to follow  Medicare Important Message Given:    Date Medicare IM Given:    Medicare IM give by:    Date Additional Medicare IM Given:    Additional Medicare Important Message give by:     If discussed at Long Length of Stay Meetings, dates discussed:    Additional Comments:  Quintella Baton, RN, BSN  Trauma/Neuro ICU Case Manager [phone]

## 2015-08-18 NOTE — Progress Notes (Signed)
STROKE TEAM PROGRESS NOTE   SUBJECTIVE (INTERVAL HISTORY) Barry Owens daughter and wife are at the bedside.  Overall he feels Barry Owens condition is stable. He has right hemianopia and cognitive impairment. As per wife, he has memory and cognitive issues for the last 5 years, getting worse after the hemorrhage.   OBJECTIVE Temp:  [97.5 F (36.4 C)-98.3 F (36.8 C)] 98.3 F (36.8 C) (09/12 1647) Pulse Rate:  [58-76] 67 (09/12 1647) Cardiac Rhythm:  [-] Heart block (09/12 1647) Resp:  [0-24] 17 (09/12 1647) BP: (104-180)/(60-139) 165/65 mmHg (09/12 1647) SpO2:  [97 %-100 %] 100 % (09/12 1647)   Recent Labs Lab 08/16/15 1223 08/16/15 1734  GLUCAP 174* 95    Recent Labs Lab 08/16/15 1244 08/18/15 0245  NA 137 139  K 4.0 3.6  CL 106 104  CO2 23 28  GLUCOSE 135* 125*  BUN 10 6  CREATININE 1.27* 0.86  CALCIUM 8.7* 8.8*    Recent Labs Lab 08/16/15 1244  AST 34  ALT 13*  ALKPHOS 57  BILITOT 1.2  PROT 7.1  ALBUMIN 3.8    Recent Labs Lab 08/16/15 1244 08/18/15 0245  WBC 5.1 4.6  HGB 13.1 13.9  HCT 40.5 42.0  MCV 83.9 82.2  PLT 188 195   No results for input(s): CKTOTAL, CKMB, CKMBINDEX, TROPONINI in the last 168 hours. No results for input(s): LABPROT, INR in the last 72 hours.  Recent Labs  08/17/15 1015  COLORURINE YELLOW  LABSPEC 1.017  PHURINE 5.5  GLUCOSEU NEGATIVE  HGBUR SMALL*  BILIRUBINUR NEGATIVE  KETONESUR NEGATIVE  PROTEINUR NEGATIVE  UROBILINOGEN 2.0*  NITRITE NEGATIVE  LEUKOCYTESUR TRACE*       Component Value Date/Time   CHOL 164 08/18/2015 0245   TRIG 56 08/18/2015 0245   HDL 44 08/18/2015 0245   CHOLHDL 3.7 08/18/2015 0245   VLDL 11 08/18/2015 0245   LDLCALC 109* 08/18/2015 0245   No results found for: HGBA1C No results found for: LABOPIA, COCAINSCRNUR, LABBENZ, AMPHETMU, THCU, LABBARB  No results for input(s): ETH in the last 168 hours.  I have personally reviewed the radiological images below and agree with the radiology  interpretations.  Ct Head Wo Contrast  08/16/2015   IMPRESSION: Acute LEFT occipital lobe intracerebral hemorrhage measuring 31 x 25 mm cross-section. Mild surrounding edema but no shift. Considerations include atypical lobar hypertensive bleed, cerebral amyloid angiopathy related hemorrhage, complication of illicit drug use or PRES, unrecognized trauma or coagulopathy, or underlying vascular malformation or vascular tumor.  Disproportionate ventricular enlargement without cortical atrophy, query normal-pressure hydrocephalus.    Mri and Mra Head Wo Contrast  08/17/2015    IMPRESSION: 1. 3.4 x 2.0 x 2.9 cm hemorrhage with local mass effect in the left parietal lobe. 2. High-grade stenosis of the proximal left posterior cerebral artery. This may represent hemorrhagic conversion of a focal infarct. 3. Disproportionate prominence of the ventricular system. Normal pressure hydrocephalus is still considered. 4. No evidence for vasculitis or previous hemorrhage. 5. Periventricular T2 changes bilaterally likely reflecting small vessel ischemic change. This could in part be related to hydrocephalus.     Carotid Doppler  Carotid Duplex Completed. Mild intimal thickening with no evidence of stenosis noted in both carotid arteries. Bilateral vertebral arteries demonstrate antegrade flow.   2D Echocardiogram  pending  PHYSICAL EXAM  Temp:  [97.5 F (36.4 C)-98.3 F (36.8 C)] 98.3 F (36.8 C) (09/12 1647) Pulse Rate:  [58-76] 67 (09/12 1647) Resp:  [0-24] 17 (09/12 1647) BP: (104-180)/(60-139) 165/65 mmHg (09/12  1647) SpO2:  [97 %-100 %] 100 % (09/12 1647)  General - Well nourished, well developed, in no apparent distress.  Ophthalmologic - fundi not visualized due to noncooperation.  Cardiovascular - Regular rate and rhythm.  Neck - supple, no carotid bruits  Mental Status -  Awake alert, and orientated place with cues, and person, but not orientated to time. Language including paucity of  speech, able to repeat, but not naming, intermittently following commands. Fund of Knowledge was assessed and was impaired, not knowing current president.  Cranial Nerves II - XII - II - right visual field hemianopsia, not blinking to visual threat on the right. III, IV, VI - Extraocular movements intact, surgical pupils, PERRL. V - Facial sensation intact bilaterally. VII - Facial movement intact bilaterally. VIII - Hearing & vestibular intact bilaterally. X - Palate elevates symmetrically. XI - Chin turning & shoulder shrug intact bilaterally. XII - Tongue protrusion intact.  Motor Strength - The patient's strength was symmetrical in all extremities and pronator drift was absent.  Bulk was normal and fasciculations were absent.   Motor Tone - Muscle tone was assessed at the neck and appendages and was normal.  Reflexes - The patient's reflexes were symmetrical in all extremities and he had no pathological reflexes.  Sensory - Light touch, temperature/pinprick were assessed and were symmetrical.    Coordination - The patient had normal movements in the hands and feet with no ataxia or dysmetria.  Tremor was absent.  Gait and Station - not tested due to safety concerns.   ASSESSMENT/PLAN Barry Owens is a 81 y.o. male with history of poorly controlled hypertension and possible dementia admitted for left occipital hemorrhage. Symptoms stable.    Left occipital ICH, hypertensive versus CAA  MRI  Left occipital ICH, no evidence of CVA   MRA  no AVM  Carotid Doppler  unremarkable  2D Echo  pending  LDL 109  HgbA1c pending  SCDs for VTE prophylaxis  Diet regular Room service appropriate?: Yes; Fluid consistency:: Thin   no antithrombotic prior to admission, now on no antithrombotic  Patient counseled to be compliant with Barry Owens antithrombotic medications  Ongoing aggressive stroke risk factor management  Therapy recommendations:  Pending  Disposition:   Pending  Hypertension  Home meds:   Lasix when necessary BP goal < 160 Currently on lisinopril 40  Unstable  Patient counseled to be compliant with Barry Owens blood pressure medications  Hyperlipidemia  Home meds:  None   LDL 109, goal < 70  Add pravastatin 20  Continue statin at discharge  Other Stroke Risk Factors  Advanced age  Other Active Problems  ? Dementia  Other Pertinent History    Hospital day # 2  This patient is critically ill due to ICH and uncontrolled hypertension and at significant risk of neurological worsening, death form recurrent bleeding, cerebral edema and brain herniation. This patient's care requires constant monitoring of vital signs, hemodynamics, respiratory and cardiac monitoring, review of multiple databases, neurological assessment, discussion with family, other specialists and medical decision making of high complexity. I spent 40 minutes of neurocritical care time in the care of this patient.   Marvel Plan, MD PhD Stroke Neurology 08/18/2015 6:46 PM    To contact Stroke Continuity provider, please refer to [email or web address]. After hours, contact General Neurology

## 2015-08-18 NOTE — Evaluation (Signed)
Physical Therapy Evaluation Patient Details Name: Barry Owens MRN: [id number] DOB: [birth year] Today's Date: 08/18/2015   History of Present Illness  81 y.o. male hx of HTN presenting to the ED with 5 days of confusion and visual changes. CT + L occippital infarct adn MRI + L parietal infarct.  Clinical Impression  Patient demonstrates deficits in functional mobility as indicated below. Will need continued skilled PT to address deficits and maximize function. Will see as indicated and progress as tolerated. At this time, patient is significantly impaired with gait and awareness, remains high fall risk. Given level of deficits, feel patient would benefit from comprehensive therapies to address cognition as well as safety/awareness during gait to ensure safety upon return home. Recommend CIR consult.    Follow Up Recommendations CIR;Supervision/Assistance - 24 hour    Equipment Recommendations  Rolling walker with 5" wheels (TBD)    Recommendations for Other Services Rehab consult     Precautions / Restrictions Precautions Precautions: Fall      Mobility  Bed Mobility Overal bed mobility: Needs Assistance Bed Mobility: Supine to Sit     Supine to sit: Min assist;+2 for safety/equipment     General bed mobility comments: Significantly increased time required to carry out task to come to EOB, increased verbal and tactil cues to initiate movement. Patient with poor ability to sustain attention throughout task.   Transfers Overall transfer level: Needs assistance Equipment used: 2 person hand held assist Transfers: Sit to/from Stand Sit to Stand: Min assist;+2 physical assistance         General transfer comment: Min assist to power up to standing with bilateral support from therapists. Patient with inability to maintain balance in standing without physical assist. heavy reliance on hand held assist.   Ambulation/Gait Ambulation/Gait assistance: Mod assist;+2 physical  assistance Ambulation Distance (Feet): 26 Feet Assistive device: 2 person hand held assist Gait Pattern/deviations: Step-through pattern;Steppage;Ataxic;Drifts right/left;Narrow base of support Gait velocity: significantly decreased despite attempts to pace  Gait velocity interpretation: <1.8 ft/sec, indicative of risk for recurrent falls General Gait Details: poor ability to coordinate functional gait pattern. Patient with noted instability requiring bilateral support (2 person wrap around with gait belt and hand held assist.) Patient with difficulty placing LEs during stride. Limiting righting reactions requiring assist during several incidence of LOB in short distance. Patient making statements about how poor his balance is during ambulation trial.  Stairs Stairs:  (not safe to assess at this time)          Wheelchair Mobility    Modified Rankin (Stroke Patients Only) Modified Rankin (Stroke Patients Only) Pre-Morbid Rankin Score: No symptoms Modified Rankin: Moderately severe disability     Balance Overall balance assessment: Needs assistance Sitting-balance support: Feet supported Sitting balance-Leahy Scale: Fair Sitting balance - Comments: in sitting EOB patient with posterior bias, unclear if this was related to sitting balance deficits, or patient attempting to reposition and return to bed   Standing balance support: Bilateral upper extremity supported;During functional activity Standing balance-Leahy Scale: Poor Standing balance comment: Bilateral UE support provided via hand held assist and use of gait belt with wrap around support. Patient with poor ability to maintain static standing, hips flexed despite cues for upright posture                             Pertinent Vitals/Pain Pain Assessment: Faces Faces Pain Scale: Hurts a little bit Pain Location: chest/electrode Pain Descriptors /  Indicators: Discomfort Pain Intervention(s): Limited activity  within patient's tolerance    Home Living Family/patient expects to be discharged to:: Private residence Living Arrangements: Spouse/significant other;Children Available Help at Discharge: Family Type of Home: House Home Access: Stairs to enter   Secretary/administrator of Steps: 2 Home Layout: Multi-level Home Equipment: None      Prior Function Level of Independence: Independent               Hand Dominance        Extremity/Trunk Assessment   Upper Extremity Assessment: Overall WFL for tasks assessed           Lower Extremity Assessment: Defer to PT evaluation      Cervical / Trunk Assessment: Normal  Communication   Communication: Prefers language other than English;HOH (swahili)  Cognition Arousal/Alertness: Awake/alert Behavior During Therapy: Restless (pulling at leads) Overall Cognitive Status: Impaired/Different from baseline Area of Impairment: Orientation;Attention;Following commands;Safety/judgement;Awareness;Problem solving;Memory Orientation Level: Disoriented to;Place;Time Current Attention Level: Sustained Memory: Decreased short-term memory Following Commands: Follows one step commands with increased time Safety/Judgement: Decreased awareness of safety;Decreased awareness of deficits Awareness: Intellectual Problem Solving: Decreased initiation;Slow processing;Requires verbal cues;Requires tactile cues;Difficulty sequencing General Comments: Patient requires max cues to perform tasks, moments of decreased attention during session.     General Comments General comments (skin integrity, edema, etc.): VSS throughout session, BP assessed  pre and post activity with systolic 140s initially, 130s post activity.    Exercises        Assessment/Plan    PT Assessment Patient needs continued PT services  PT Diagnosis Difficulty walking;Abnormality of gait;Altered mental status   PT Problem List Decreased activity tolerance;Decreased  balance;Decreased mobility;Decreased coordination;Decreased cognition;Decreased safety awareness  PT Treatment Interventions DME instruction;Gait training;Stair training;Functional mobility training;Therapeutic activities;Therapeutic exercise;Balance training;Cognitive remediation;Patient/family education   PT Goals (Current goals can be found in the Care Plan section) Acute Rehab PT Goals Patient Stated Goal: to go home PT Goal Formulation: With patient/family Time For Goal Achievement: 09/01/15 Potential to Achieve Goals: Fair    Frequency Min 4X/week   Barriers to discharge Inaccessible home environment      Co-evaluation PT/OT/SLP Co-Evaluation/Treatment: Yes Reason for Co-Treatment: Complexity of the patient's impairments (multi-system involvement);Necessary to address cognition/behavior during functional activity;For patient/therapist safety PT goals addressed during session: Mobility/safety with mobility;Balance         End of Session Equipment Utilized During Treatment: Gait belt Activity Tolerance: Patient tolerated treatment well Patient left: in chair;with call bell/phone within reach;with chair alarm set;with family/visitor present Nurse Communication: Mobility status         Time: 1350-1437 PT Time Calculation (min) (ACUTE ONLY): 47 min   Charges:   PT Evaluation $Initial PT Evaluation Tier I: 1 Procedure     PT G CodesFabio Asa Aug 27, 2015, 6:00 PM Charlotte Crumb, PT DPT  [phone]

## 2015-08-18 NOTE — Progress Notes (Signed)
Preliminary results by tech - Carotid Duplex Completed. Mild intimal thickening with no evidence of stenosis noted in both carotid arteries. Bilateral vertebral arteries demonstrate antegrade flow.  Marilynne Halsted, BS, RDMS, RVT

## 2015-08-19 ENCOUNTER — Inpatient Hospital Stay (HOSPITAL_COMMUNITY): Payer: Medicaid Other

## 2015-08-19 DIAGNOSIS — E785 Hyperlipidemia, unspecified: Secondary | ICD-10-CM

## 2015-08-19 DIAGNOSIS — I6789 Other cerebrovascular disease: Secondary | ICD-10-CM

## 2015-08-19 LAB — BASIC METABOLIC PANEL
ANION GAP: 8 (ref 5–15)
BUN: 7 mg/dL (ref 6–20)
CALCIUM: 8.5 mg/dL — AB (ref 8.9–10.3)
CO2: 26 mmol/L (ref 22–32)
Chloride: 104 mmol/L (ref 101–111)
Creatinine, Ser: 1.03 mg/dL (ref 0.61–1.24)
GFR calc Af Amer: >60 mL/min (ref >60)
GLUCOSE: 110 mg/dL — AB (ref 65–99)
POTASSIUM: 3.7 mmol/L (ref 3.5–5.1)
Sodium: 138 mmol/L (ref 135–145)

## 2015-08-19 LAB — CBC
HEMATOCRIT: 40.4 % (ref 39.0–52.0)
Hemoglobin: 13.3 g/dL (ref 13.0–17.0)
MCH: 26.8 pg (ref 26.0–34.0)
MCHC: 32.9 g/dL (ref 30.0–36.0)
MCV: 81.5 fL (ref 78.0–100.0)
PLATELETS: 181 10*3/uL (ref 150–400)
RBC: 4.96 MIL/uL (ref 4.22–5.81)
RDW: 13.8 % (ref 11.5–15.5)
WBC: 4.4 10*3/uL (ref 4.0–10.5)

## 2015-08-19 LAB — HEMOGLOBIN A1C
Hgb A1c MFr Bld: 6 % — ABNORMAL HIGH (ref 4.8–5.6)
Mean Plasma Glucose: 126 mg/dL

## 2015-08-19 MED ORDER — LISINOPRIL 20 MG PO TABS: 20.0000 mg | ORAL_TABLET | Freq: Two times a day (BID) | ORAL | Status: DC

## 2015-08-19 MED ORDER — PRAVASTATIN SODIUM 20 MG PO TABS: 20.0000 mg | ORAL_TABLET | Freq: Every day | ORAL | Status: DC

## 2015-08-19 NOTE — Progress Notes (Addendum)
Occupational Therapy Treatment Patient Details Name: Barry Owens MRN: [id number] DOB: December 28, 1930 Today's Date: 08/19/2015    History of present illness 81 y.o. male hx of HTN presenting to the ED with 5 days of confusion and visual changes. CT + L occippital infarct adn MRI + L parietal infarct.   OT comments  This 81 yo male admitted with above presents to acute OT with increased balance today and thus increased mobility, but is still limited due to decreased cognition and decreased vision/inattention right visual field. He will continue to benefit from [facility].  Follow Up Recommendations  Home health OT;Supervision/Assistance - 24 hour (pt would really benefit from [facility], however if family is concerned financially then HHPT would be primary as long as they also work on vision )    Equipment Recommendations  None recommended by OT       Precautions / Restrictions Precautions Precautions: Fall Restrictions Weight Bearing Restrictions: No       Mobility Bed Mobility Overal bed mobility: Needs Assistance             General bed mobility comments: Staff in room getting pt to EOB as we entered room  Transfers Overall transfer level: Needs assistance Equipment used: 1 person hand held assist Transfers: Sit to/from Stand Sit to Stand: Min guard              Balance Overall balance assessment: Needs assistance Sitting-balance support: Feet supported;No upper extremity supported Sitting balance-Leahy Scale: Good Sitting balance - Comments: in sitting EOB patient with posterior bias, unclear if this was related to sitting balance deficits, or patient attempting to reposition and return to bed   Standing balance support: Single extremity supported Standing balance-Leahy Scale: Fair Standing balance comment: can not self correct with challenge                   ADL Overall ADL's : Needs assistance/impaired     Grooming: Wash/dry hands;Supervision/safety;Set  up;Standing Grooming Details (indicate cue type and reason): pt perseverates on tasks                 Toilet Transfer: Min guard;Ambulation (to stand over toilet to urinate)             General ADL Comments: Family always A with LBD due to pt's bad knees and hips. Spoke with pt, grand-daughter, and daughter about the recommendation for a shower seat and hand held shower as well as S throughout showering activity for safety. Recommended to pt, grandaughter, and daughter that pt needs to have someone with him anytime he is up on his feet and that they may want to consider a way to monitor him if they cannot be in the same room with him all the time (ie: bell on him, audio and/or video baby monitor).                Cognition   Behavior During Therapy: WFL for tasks assessed/performed Overall Cognitive Status: Impaired/Different from baseline Area of Impairment: Orientation;Attention;Memory;Following commands;Safety/judgement;Awareness;Problem solving Orientation Level: Situation;Disoriented to Current Attention Level: Sustained Memory: Decreased short-term memory  Following Commands: Follows one step commands inconsistently;Follows one step commands with increased time (follow one step commands with increased multiple modal cues) Safety/Judgement: Decreased awareness of safety;Decreased awareness of deficits (due to vision) Awareness: Intellectual Problem Solving: Decreased initiation;Requires verbal cues;Requires tactile cues (requires gestural cues)                   Pertinent Vitals/ Pain  Pain Assessment: Faces Pain Score: 4  Pain Location: Bil knees and right hip Pain Descriptors / Indicators: Aching;Sore;Discomfort Pain Intervention(s): Monitored during session (pre-medicated with tylenol before sesson per grand-daughter)         Frequency Min 3X/week     Progress Toward Goals  OT Goals(current goals can now be found in the care plan section)   Progress towards OT goals: Progressing toward goals  Acute Rehab OT Goals Patient Stated Goal: to go home  Plan Discharge plan needs to be updated    Co-evaluation    PT/OT/SLP Co-Evaluation/Treatment: Yes     OT goals addressed during session: ADL's and self-care;Strengthening/ROM      End of Session Equipment Utilized During Treatment: Gait belt   Activity Tolerance Patient tolerated treatment well   Patient Left in chair;with call bell/phone within reach;with chair alarm set;with family/visitor present   Nurse Communication          Time: 0820-0902 OT Time Calculation (min): 42 min  Charges: OT General Charges $OT Visit: 1 Procedure OT Treatments $Self Care/Home Management : 8-22 mins  Evette Georges 08/19/2015, 9:23 AM

## 2015-08-19 NOTE — Progress Notes (Signed)
Physical medicine rehabilitation consult requested chart reviewed. As per physical and occupational therapy notes today 08/19/2015 recommendations offered home health therapies as patient is at min guard ambulating one person hand-held assistance. Patient will not need inpatient rehabilitation services recommendations are discharged to home

## 2015-08-19 NOTE — Discharge Summary (Signed)
Stroke Discharge Summary  Patient ID: Barry Owens   MRN: [id number]      DOB: 12/06/1931  Date of Admission: 08/16/2015 Date of Discharge: 08/19/2015  Attending Physician:  Marvel Plan, MD, Stroke MD  Consulting Physician(s):     rehabilitation medicine  Patient's PCP:  Tally Due, MD  DISCHARGE DIAGNOSIS:  Active Problems:   ICH (intracerebral hemorrhage), left occipital   HTN   HLD    Dementia    BMI: Body mass index is 36.14 kg/(m^2).  History reviewed. No pertinent past medical history. Past Surgical History  Procedure Laterality Date  . Eye surgery        Medication List    ASK your doctor about these medications        acetaminophen 650 MG CR tablet  Commonly known as:  TYLENOL  Take 1,300 mg by mouth every 8 (eight) hours as needed for pain.     clotrimazole-betamethasone cream  Commonly known as:  LOTRISONE  Apply 1 application topically 2 (two) times daily.     furosemide 20 MG tablet  Commonly known as:  LASIX  Take 1 tablet (20 mg total) by mouth daily.     multivitamin tablet  Take 1 tablet by mouth daily.        LABORATORY STUDIES CBC    Component Value Date/Time   WBC 4.4 08/19/2015 0700   RBC 4.96 08/19/2015 0700   HGB 13.3 08/19/2015 0700   HCT 40.4 08/19/2015 0700   PLT 181 08/19/2015 0700   MCV 81.5 08/19/2015 0700   MCH 26.8 08/19/2015 0700   MCHC 32.9 08/19/2015 0700   RDW 13.8 08/19/2015 0700   CMP    Component Value Date/Time   NA 138 08/19/2015 0700   K 3.7 08/19/2015 0700   CL 104 08/19/2015 0700   CO2 26 08/19/2015 0700   GLUCOSE 110* 08/19/2015 0700   BUN 7 08/19/2015 0700   CREATININE 1.03 08/19/2015 0700   CREATININE 0.95 04/29/2012 1506   CALCIUM 8.5* 08/19/2015 0700   PROT 7.1 08/16/2015 1244   ALBUMIN 3.8 08/16/2015 1244   AST 34 08/16/2015 1244   ALT 13* 08/16/2015 1244   ALKPHOS 57 08/16/2015 1244   BILITOT 1.2 08/16/2015 1244   GFRNONAA >60 08/19/2015 0700   GFRAA >60 08/19/2015 0700    COAGSNo results found for: INR, PROTIME Lipid Panel    Component Value Date/Time   CHOL 164 08/18/2015 0245   TRIG 56 08/18/2015 0245   HDL 44 08/18/2015 0245   CHOLHDL 3.7 08/18/2015 0245   VLDL 11 08/18/2015 0245   LDLCALC 109* 08/18/2015 0245   HgbA1C  Lab Results  Component Value Date   HGBA1C 6.0* 08/18/2015   Cardiac Panel (last 3 results) No results for input(s): CKTOTAL, CKMB, TROPONINI, RELINDX in the last 72 hours. Urinalysis    Component Value Date/Time   COLORURINE YELLOW 08/17/2015 1015   APPEARANCEUR CLOUDY* 08/17/2015 1015   LABSPEC 1.017 08/17/2015 1015   PHURINE 5.5 08/17/2015 1015   GLUCOSEU NEGATIVE 08/17/2015 1015   HGBUR SMALL* 08/17/2015 1015   BILIRUBINUR NEGATIVE 08/17/2015 1015   KETONESUR NEGATIVE 08/17/2015 1015   PROTEINUR NEGATIVE 08/17/2015 1015   UROBILINOGEN 2.0* 08/17/2015 1015   NITRITE NEGATIVE 08/17/2015 1015   LEUKOCYTESUR TRACE* 08/17/2015 1015   Urine Drug Screen No results found for: LABOPIA, COCAINSCRNUR, LABBENZ, AMPHETMU, THCU, LABBARB  Alcohol Level No results found for: [facility]   SIGNIFICANT DIAGNOSTIC STUDIES I have personally reviewed the  radiological images below and agree with the radiology interpretations.  Ct Head Wo Contrast  08/16/2015   IMPRESSION: Acute LEFT occipital lobe intracerebral hemorrhage measuring 31 x 25 mm cross-section. Mild surrounding edema but no shift. Considerations include atypical lobar hypertensive bleed, cerebral amyloid angiopathy related hemorrhage, complication of illicit drug use or PRES, unrecognized trauma or coagulopathy, or underlying vascular malformation or vascular tumor.  Disproportionate ventricular enlargement without cortical atrophy, query normal-pressure hydrocephalus.    Mri and Mra Head Wo Contrast  08/17/2015    IMPRESSION: 1. 3.4 x 2.0 x 2.9 cm hemorrhage with local mass effect in the left parietal lobe. 2. High-grade stenosis of the proximal left posterior cerebral artery.  This may represent hemorrhagic conversion of a focal infarct. 3. Disproportionate prominence of the ventricular system. Normal pressure hydrocephalus is still considered. 4. No evidence for vasculitis or previous hemorrhage. 5. Periventricular T2 changes bilaterally likely reflecting small vessel ischemic change. This could in part be related to hydrocephalus.     Carotid Doppler  Carotid Duplex Completed. Mild intimal thickening with no evidence of stenosis noted in both carotid arteries. Bilateral vertebral arteries demonstrate antegrade flow.   2D Echocardiogram  - Left ventricle: The cavity size was normal. Wall thickness was normal. Systolic function was normal. The estimated ejection fraction was in the range of 55% to 60%. Wall motion was normal; there were no regional wall motion abnormalities. Doppler parameters are consistent with abnormal left ventricular relaxation (grade 1 diastolic dysfunction). The E/e&' ratio is between 8-15, suggesting indeterminate LV filling pressure. - Left atrium: The atrium was normal in size. - Atrial septum: No defect or patent foramen ovale was identified.  Impressions:  - LVEF 55-60%, normal wall motion, diastolic dysfunction, indeterminate LV filling pressure, normal LA size, no PFO by color doppler.     HISTORY OF PRESENT ILLNESS Barry Owens is an 81 y.o. male hx of HTN presenting to the ED with 5 days of confusion. Daughter notes baseline confusion for the past few months but notes it has been markedly worse the past 5 days. He reports difficulty with vision though notes this has improved. He remains confused. Denies any noted weakness or speech deficits. No recent trauma. He denies being on any anticoagulation.  CT head imaging reviewed, shows left occipital lobe ICH. BP upon presentation to ED was 131/81.   Date last known well: 08/11/2015 Time last known well: unclear tPA Given: no, ICH Modified Rankin: Rankin  Score=1   HOSPITAL COURSE [name] is a 81 y.o. male with history of poorly controlled hypertension and possible dementia admitted for left occipital hemorrhage. Symptoms stable.    Left occipital ICH, hypertensive versus CAA  MRI  Left occipital ICH, no evidence of CAA   MRA  no AVM  Carotid Doppler  unremarkable  2D Echo  unremarkable  LDL 109  HgbA1c 6.0  SCDs for VTE prophylaxis  Diet regular Room service appropriate?: Yes; Fluid consistency:: Thin   no antithrombotic prior to admission, now on no antithrombotic due to ICH  Patient counseled to be compliant with his antithrombotic medications  Ongoing aggressive stroke risk factor management  Therapy recommendations:  Pending  Disposition:  Pending  Hypertension  Home meds:   Lasix when necessary BP goal < 160 Currently on lisinopril 20mg  bid  Stable  Patient counseled to be compliant with his blood pressure medications  Hyperlipidemia  Home meds:  None   LDL 109, goal < 70  Add pravastatin 20  Continue  statin at discharge  Other Stroke Risk Factors  Advanced age  Other Active Problems  Dementia  Other Pertinent History   DISCHARGE EXAM  Temp:  [97.7 F (36.5 C)-98.8 F (37.1 C)] 98.4 F (36.9 C) (09/13 1307) Pulse Rate:  [62-79] 62 (09/13 1307) Resp:  [15-20] 18 (09/13 1307) BP: (119-165)/(54-82) 139/63 mmHg (09/13 1307) SpO2:  [98 %-100 %] 100 % (09/13 1307)  General - Well nourished, well developed, in no apparent distress.  Ophthalmologic - fundi not visualized due to noncooperation.  Cardiovascular - Regular rate and rhythm.  Neck - supple, no carotid bruits  Mental Status -  Awake alert, and orientated place with cues, and person, but not orientated to time. Language including paucity of speech, able to repeat, but not naming, intermittently following commands. Fund of Knowledge was assessed and was impaired, not knowing current president.  Cranial Nerves  II - XII - II - right visual field hemianopsia, not blinking to visual threat on the right. III, IV, VI - Extraocular movements intact, surgical pupils, PERRL. V - Facial sensation intact bilaterally. VII - Facial movement intact bilaterally. VIII - Hearing & vestibular intact bilaterally. X - Palate elevates symmetrically. XI - Chin turning & shoulder shrug intact bilaterally. XII - Tongue protrusion intact.  Motor Strength - The patient's strength was symmetrical in all extremities and pronator drift was absent.  Bulk was normal and fasciculations were absent.   Motor Tone - Muscle tone was assessed at the neck and appendages and was normal.  Reflexes - The patient's reflexes were symmetrical in all extremities and he had no pathological reflexes.  Sensory - Light touch, temperature/pinprick were assessed and were symmetrical.    Coordination - The patient had normal movements in the hands and feet with no ataxia or dysmetria.  Tremor was absent.  Gait and Station - not tested due to safety concerns.  Discharge Diet   Diet regular Room service appropriate?: Yes; Fluid consistency:: Thin liquids  DISCHARGE PLAN  Disposition:  Home with [facility] PT RN social worker   no antithrombotic due to ICH.  Follow-up GUEST, Loretha Stapler, MD in 2 weeks.  Follow-up with Dr. Marvel Plan, Stroke Clinic in 2 months.  35 minutes were spent preparing discharge.  Marvel Plan, MD PhD Stroke Neurology 08/19/2015 1:10 PM    To contact Stroke Continuity provider, please refer to [email or web address]. After hours, contact General Neurology

## 2015-08-19 NOTE — Progress Notes (Signed)
  Echocardiogram 2D Echocardiogram has been performed.  [name] 08/19/2015, 9:57 AM

## 2015-08-19 NOTE — Care Management Note (Signed)
Case Management Note  Patient Details  Name: Barry Owens MRN: [id number] Date of Birth: 08/13/1931  Subjective/Objective:                    Action/Plan: Patient admitted with [facility]. Pt lives at home with family. Pt does not have PCP or insurance. Elizabeth with financial planning is following. CM met with patient and daughter at the bedside and they denied patient having a PCP. Patient and daughter informed of the [facility] and they agreed to being set up with the clinic. [facility] was called and appointment made since they are seeing patients for the [facility] currently.  Appointment added to the AVS. Pt also being set up for Home Health services at discharge. Miranda with [facility] notified and she accepted the referral.  Bedside RN aware.   Expected Discharge Date:                  Expected Discharge Plan:  [place]  In-House Referral:     Discharge planning Services  CM Consult  Post Acute Care Choice:    Choice offered to:     DME Arranged:    DME Agency:     HH Arranged:  RN, PT (CSW) [place] Agency:  [facility]  Status of Service:  Completed, signed off  Medicare Important Message Given:    Date Medicare IM Given:    Medicare IM give by:    Date Additional Medicare IM Given:    Additional Medicare Important Message give by:     If discussed at [place] of Stay Meetings, dates discussed:    Additional Comments:  Ollen Gross, RN 08/19/2015, 1:47 PM

## 2015-08-19 NOTE — Progress Notes (Signed)
Patient discharged home with Home health and family supervision discharge summary reviewed, prescription given and IV removed, he is alert reasonable oriented and no complaints of pain.

## 2015-08-19 NOTE — Evaluation (Signed)
Speech Language Pathology Evaluation Patient Details Name: Barry Owens MRN: [id number] DOB: July 03, 1931 Today's Date: 08/19/2015 Time: 1050-1108 SLP Time Calculation (min) (ACUTE ONLY): 18 min  Problem List:  Patient Active Problem List   Diagnosis Date Noted  . ICH (intracerebral hemorrhage) 08/16/2015  . Nontraumatic cortical hemorrhage of cerebral hemisphere    Past Medical History: History reviewed. No pertinent past medical history. Past Surgical History:  Past Surgical History  Procedure Laterality Date  . Eye surgery     HPI:    81 y.o. male with history of poorly controlled hypertension and possible dementia admitted for left occipital hemorrhage.  Pt with hx of five year decline in cognition.     Assessment / Plan / Recommendation Clinical Impression  81 year old (age incorrect in records per dtr) retired Engineer, site and principal from [place] presents with acute-on-chronic cognitve deficits, marked primarily by memory disorder.  Pt often does not recognize family members, but is talkative and interactive.  Daughter states he responds best to information delivered in his primary language, although his understanding/use of English is quite good.  Family feels he is approaching baseline with regard to his cognition.  No SLP f/u is needed.  Will sign off.  Will continue to require 24/7 supervison.      SLP Assessment  Patient does not need any further Speech Lanaguage Pathology Services    Follow Up Recommendations  None    Frequency and Duration        Pertinent Vitals/Pain Pain Assessment: Faces Pain Score: 4  Pain Location: Bil knees and right hip Pain Descriptors / Indicators: Aching;Sore;Discomfort Pain Intervention(s): Monitored during session (pre-medicated with tylenol before sesson per grand-daughter)   SLP Goals     SLP Evaluation Prior Functioning  Cognitive/Linguistic Baseline: Baseline deficits Baseline deficit details: memory (often does not  recognize family members) Type of Home: House  Lives With: Daughter Available Help at Discharge: Family Education: college Vocation: Retired (retired Engineer, site and priciple)   Cognition  Overall Cognitive Status: History of cognitive impairments - at baseline (daughter believes he is approaching baseline) Arousal/Alertness: Awake/alert Orientation Level: Oriented to person;Disoriented to situation;Disoriented to time;Oriented to place Attention: Sustained Sustained Attention: Appears intact Memory: Impaired Memory Impairment: Storage deficit;Retrieval deficit;Decreased long term memory;Decreased short term memory Awareness: Impaired Awareness Impairment: Intellectual impairment Problem Solving: Impaired Problem Solving Impairment: Verbal basic;Functional basic    Comprehension  Auditory Comprehension Overall Auditory Comprehension: Appears within functional limits for tasks assessed Reading Comprehension Reading Status: Not tested    Expression Expression Primary Mode of Expression: Verbal Verbal Expression Overall Verbal Expression: Appears within functional limits for tasks assessed Written Expression Dominant Hand: Right Written Expression: Within Functional Limits   Oral / Motor Oral Motor/Sensory Function Overall Oral Motor/Sensory Function: Appears within functional limits for tasks assessed Motor Speech Overall Motor Speech: Appears within functional limits for tasks assessed   GO     Blenda Mounts Laurice 08/19/2015, 11:18 AM

## 2015-08-19 NOTE — Progress Notes (Signed)
Physical Therapy Treatment Patient Details Name: Barry Owens MRN: [id number] DOB: 1931-06-08 Today's Date: 08/19/2015    History of Present Illness 81 y.o. male hx of HTN presenting to the ED with 5 days of confusion and visual changes. CT + L occippital infarct adn MRI + L parietal infarct.    PT Comments    Patient seen this am for progression of mobility and further assessment for possibility of returning home. Patient with significant improvements in functional mobility but overall remains limited and high fall rish secondary to visual and perceptive deficits. Educated family extensively on safety with mobility as well as home care and management of patient in current condition. Family appears very receptive and willing to provide level of assist required. Educated on guarding techniques as well as methods to monitor patient mobility. At this time, recommendations for home with family assist for mobility and highly recommend HHPT services. Will continue to see and progress as tolerated. Do note feel use of RW would be save for patient at this time, given visual and perceptive component of deficits, in conjunction with patient confusion.   Follow Up Recommendations  Home health PT;Supervision/Assistance - 24 hour;Supervision for mobility/OOB (hands on assist for mobility)     Equipment Recommendations  None    Recommendations for Other Services Rehab consult     Precautions / Restrictions Precautions Precautions: Fall Restrictions Weight Bearing Restrictions: No    Mobility  Bed Mobility Overal bed mobility: Needs Assistance             General bed mobility comments: Staff in room getting pt to EOB as we entered room  Transfers Overall transfer level: Needs assistance Equipment used: 1 person hand held assist Transfers: Sit to/from Stand Sit to Stand: Min guard            Ambulation/Gait Ambulation/Gait assistance: Min guard;Min assist Ambulation  Distance (Feet): 160 Feet Assistive device: 1 person hand held assist Gait Pattern/deviations: Step-through pattern;Decreased stride length;Shuffle (improvements with gait placement)   Gait velocity interpretation: Below normal speed for age/gender General Gait Details: patient demonstrates improvements in stride and placement of gait, improved overall control and coordination of gait. Still remains limited by visual and perceptive deficits, remains high fall risk. educated family on guarding techniques and safety with mobility   Stairs Stairs: Yes Stairs assistance: Min assist Stair Management: Two rails;Step to pattern;Forwards Number of Stairs: 5 General stair comments: increased time to perform, assist for safety  Wheelchair Mobility    Modified Rankin (Stroke Patients Only)       Balance Overall balance assessment: Needs assistance Sitting-balance support: Feet supported;No upper extremity supported Sitting balance-Leahy Scale: Good Sitting balance - Comments: in sitting EOB patient with posterior bias, unclear if this was related to sitting balance deficits, or patient attempting to reposition and return to bed   Standing balance support: Single extremity supported Standing balance-Leahy Scale: Fair Standing balance comment: can not self correct with challenge                    Cognition Arousal/Alertness: Awake/alert Behavior During Therapy: WFL for tasks assessed/performed Overall Cognitive Status: Impaired/Different from baseline Area of Impairment: Orientation;Attention;Memory;Following commands;Safety/judgement;Awareness;Problem solving Orientation Level: Situation;Disoriented to Current Attention Level: Sustained Memory: Decreased short-term memory Following Commands: Follows one step commands inconsistently;Follows one step commands with increased time (follow one step commands with increased multiple modal cues) Safety/Judgement: Decreased awareness of  safety;Decreased awareness of deficits (due to vision) Awareness: Intellectual Problem Solving: Decreased initiation;Requires verbal  cues;Requires tactile cues (requires gestural cues)      Exercises      General Comments General comments (skin integrity, edema, etc.): extensive education provided to granddaughter as well as daughter regarding mobility needs, visual deficits and safety with possible discharge home.       Pertinent Vitals/Pain Pain Assessment: Faces Pain Score: 4  Pain Location: Bil knees and right hip Pain Descriptors / Indicators: Aching;Sore;Discomfort Pain Intervention(s): Monitored during session (pre-medicated with tylenol before sesson per grand-daughter)    Home Living                      Prior Function            PT Goals (current goals can now be found in the care plan section) Acute Rehab PT Goals Patient Stated Goal: to go home PT Goal Formulation: With patient/family Time For Goal Achievement: 09/01/15 Potential to Achieve Goals: Fair Progress towards PT goals: Progressing toward goals    Frequency  Min 4X/week    PT Plan Discharge plan needs to be updated    Co-evaluation       OT goals addressed during session: ADL's and self-care;Strengthening/ROM     End of Session Equipment Utilized During Treatment: Gait belt Activity Tolerance: Patient tolerated treatment well Patient left: in chair;with call bell/phone within reach;with chair alarm set;with family/visitor present     Time: 0820-0902 PT Time Calculation (min) (ACUTE ONLY): 42 min  Charges:  $Gait Training: 8-22 mins $Self Care/Home Management: 8-22                    G CodesFabio Asa 08-31-15, 9:20 AM Charlotte Crumb, PT DPT  [phone]

## 2015-08-20 ENCOUNTER — Other Ambulatory Visit: Payer: Self-pay

## 2015-08-20 NOTE — Patient Outreach (Signed)
Outreach call made to obtain verbal consent to participate in Stroke Emmi Transition program. Left HIPAA compliant voicemail.

## 2015-08-21 ENCOUNTER — Other Ambulatory Visit: Payer: Self-pay | Admitting: Internal Medicine

## 2015-08-21 ENCOUNTER — Telehealth: Payer: Self-pay | Admitting: Neurology

## 2015-08-21 DIAGNOSIS — I639 Cerebral infarction, unspecified: Secondary | ICD-10-CM

## 2015-08-21 NOTE — Telephone Encounter (Signed)
I give my verbal order for the [facility]. Please call them to setup. Thanks for your help.  Marvel Plan, MD PhD Stroke Neurology 08/21/2015 5:57 PM

## 2015-08-21 NOTE — Telephone Encounter (Signed)
[facility] [phone]) [phone] called to request verbal order for home health speech therapy

## 2015-08-21 NOTE — Telephone Encounter (Signed)
From your discharge summary note, 08-19-15 you not HH PT for pt, [facility] requesting order for [facility].  Please advise.

## 2015-08-21 NOTE — Patient Outreach (Signed)
Granddaughter heard back from [facility] and confirmed PT has already made a visit.  Confirmed household has my number in case of future needs.

## 2015-08-21 NOTE — Patient Outreach (Addendum)
Patient's granddaughter called to ask if we were the home health agency.  I have confirmed with her that [facility] accepted this referral.  I assured her they would be calling her. Voicemail left with [facility] to confirm family is expecting to hear from [facility].  Will document when I hear back and will make outreach to granddaughter. Sharon Seller, MHA [facility] Care Management [phone]

## 2015-08-22 ENCOUNTER — Other Ambulatory Visit: Payer: Self-pay

## 2015-08-22 NOTE — Patient Outreach (Addendum)
[facility]) Care Management  08/22/2015  Barry Owens 1931-03-22 [id number]   Triggered RED on EMMI Stroke Dashboard, assigned [name], RN to outreach.  Thanks, Corrie Mckusick. Sharlee Blew [facility] Care Management [facility] CM Assistant Phone: [phone] Fax: [phone]

## 2015-08-22 NOTE — Patient Outreach (Signed)
[facility]) Care Management  08/22/2015  SIDI DZIKOWSKI [birth year] [id number]  Telephonic Emmi / Stroke Assessment  Referral Date:  08/22/15 Referral issue:  Red alert on Emmi Stroke Dashboard. Issue:  Patient responded feeling worse overall and new problems with walking/talking/speaking/seeing.   PCP:  None:   [facility] appt pending.  Per discharge order:  Follow-up GUEST, [name], MD in 2 weeks.  Follow-up with Dr. Rosalin Hawking, Stroke Clinic in 2 months. HH services:  SN, PT, OT, ST and SW with [facility].  Outreach call to patient.  Caregiver/Daughter/[name] Demorest reached.  Daughter states she is at work but just spoke to her family in the home with patient who reported patient is doing better.  States no issues and not sure why responses where answered that way.    Social:  Patient lives in the home with his family.  Falls:  None.  Patient is walking with no assistive devices and started Physical Therapy yesterday.  States therapist advised doing so well that OT/ST therapy is needed more than PT at this time.   Patient was able to walk up and down steps yesterday with PT.   Caregiver:  Family Transportation: Family DME: None  left occipital hemorrhage 08/2015 States patient is having no pain so not sure why he answered feeling worse all over.   States patient does have confusion which has not worsened and seems some better and this is not a new symptom.  States family member in the home has a BP cuff and checking patients BP.  States BP readings have been "good".   Other Dx:  HTN/uncontrolled, hyperlipidemia, Dementia.   Medications:  Denies any issues; taking as ordered and has all medications available.  Meds administered by family.  H/o no insurance for medication coverage.     Consent: Daughter/[name] states patient has confusion and will not be able to complete calls.  Daughter agrees to [facility] STroke Program with [facility].    Plan:   Follow-up appt's / PCP: RN CM reviewed recent CM Discharge note: H/o Pt does not have PCP or insurance.  Patient is followed by Benjamine Mola with [facility]  financial planning.  H/o appointment arranged with [facility] since they are seeing patients for the [facility] at this time.  RN CM will follow-up with caregiver on appointment adherence.  Per Epic MR: appt scheduled with  [name], FNP, Sickle Cell Clinic on 09/05/15.   Stroke RN CM will provide education on signs and symptoms of Stroke. RN CM will provide education on importance of BP management  RN CM provide education on importance of logging BP readings to take to all MD appts.  RN CM will mail weight and BP log sheet.  RN CM will provide education on secondary prevention and mail EMMI Stroke Education:  What You Can Do To prevent A Second Stroke.   Medications:  RN CM will continue to assess for adherence and if patient is getting all medications due to no insurance.  RN CM will explore cost of meds on next contact call.   SW issues: RN CM will follow-up with [facility] for update on SW services due to not sure if current needs are being met and covered via indigent program. RN CM identifies need for Financial assessment, Medicaid eligibility assessment, and resources.   RN CM will schedule for next [name] Week 2 contact call within 1 week.  RN CM notified [place]  Assistant - case opened.  RN CM advised to please notify MD of any changes in condition prior to scheduled appt's.   RN CM provided contact name and #, 24-hour nurse line # [phone].   RN CM confirmed patient is aware of 911 services for urgent emergency needs.  Mariann Laster, RN, BSN, [facility], CCM  [employer] Management Coordinator [phone] Direct [phone] Cell [phone] Office [phone] Fax

## 2015-08-25 ENCOUNTER — Other Ambulatory Visit: Payer: Self-pay

## 2015-08-25 NOTE — Progress Notes (Signed)
This encounter was created in error - please disregard.

## 2015-08-25 NOTE — Telephone Encounter (Signed)
Placed order in the sytem.

## 2015-08-25 NOTE — Telephone Encounter (Signed)
I called Kelly with [facility] and gave her verbal ok for [facility] ST per Dr. Roda Shutters.  She verbalized understanding.

## 2015-08-25 NOTE — Patient Outreach (Signed)
[facility] [facility]) Care Management  08/25/2015  Barry Owens [facility] 1931/05/24 [id number]  Care Coordination Note  Outbound call to [place] [phone] Contact:  Stevie Kern RN CM confirmed [facility] providing SN, PT, ST and SW services through [employer] / charity program.   RN CM provided update that per Epic:  MCD potential/pending status.   RN CM advised RN CM would like update back from SW regarding SW objectives and services provided to avoid duplication of services and assure patient's needs are being met.   RN CM advised confirmation on MCD application and progress requested.    Mariann Laster, RN, BSN, [facility], CCM  [employer] Management Coordinator [phone] Direct [phone] Cell [phone] Office [phone] Fax

## 2015-08-25 NOTE — Addendum Note (Signed)
Addended byHermenia Fiscal on: 08/25/2015 08:21 AM   Modules accepted: Orders

## 2015-08-26 NOTE — Progress Notes (Signed)
This encounter was created in error - please disregard.

## 2015-08-27 ENCOUNTER — Other Ambulatory Visit: Payer: Self-pay

## 2015-08-27 NOTE — Patient Outreach (Signed)
[facility]) Care Management  08/27/2015  Barry Owens 04/25/1931 [id number]  Telephonic Care Management Note  Emmi Red Alert Notification received 08/27/15 Patient responed to Emmi:  Feelin worse overall? Yes New problems walking/talking/speaking/seeing? No Questions/problems with meds? Yes   Outreach call to patient; patient not reached.  RN CM left HIPAA compliant voice message requesting call back.    [name], RN, BSN, [facility], CCM  [employer] Management Coordinator [phone] Direct [phone] Cell [phone] Office [phone] Fax

## 2015-08-27 NOTE — Patient Outreach (Signed)
[facility]) Care Management  08/27/2015  Barry Owens [birth year] [id number]   Triggered RED on EMMI Stroke Dashboard, notification sent to [name], RN.  Thanks, Corrie Mckusick. Sharlee Blew [facility] Care Management [facility] CM Assistant Phone: [phone] Fax: [phone]

## 2015-08-28 ENCOUNTER — Other Ambulatory Visit: Payer: Self-pay

## 2015-08-28 NOTE — Patient Outreach (Signed)
[facility]) Care Management  08/28/2015  Barry Owens [birth year] [id number]  Telephonic Emmi / Stroke Assessment (Week 2)  Red Alert 08/27/15  Referral Date: 08/22/15 Referral issue: Red alert on Emmi Stroke Dashboard. 08/21/20, 08/27/15.   Per Epic Discharge Summary Plan:  PCP: None: [facility] appt pending.  Per discharge order:  Follow-up GUEST, Barry Melter, MD in 2 weeks.  Follow-up with Dr. Rosalin Hawking, Stroke Clinic in 2 months. HH services: SN, PT, OT, ST and SW with [facility].   Outreach call to patient's caregiver/Daughter/Barry Owens.    Social: Patient lives in the home with his family. Patient has demential and requires family to help manage his care.  Falls: None. Patient is walking with no assistive devices.  Active with [facility] services:  [facility]. Caregiver: Family Transportation: Family DME: None  Stroke left occipital hemorrhage 08/2015 Daughter states patient is doing very well.  SN and PT services are coming and patient is looking really good.  States patient's granddaughter/Barry Owens will be taking patient to his MD appt and has questions regarding appt instructions.  States patient has appt scheduled with [facility] on 09/05/15 at 9:15.  [address] [place], [place].  States patient does not have Sickle Cell.    RN CM provided daughter and granddaughter update on follow-up appt with Dr. Elder Cyphers. appt for New Patient post hospital discharge.  Office contact confirmed appt for 09/05/15 at 9:00 (arrive at 8;45 for paper work). Patient scheduled to see Barry Robert, NP.  Location: [address], [place], [place] [address] [phone] (which is next to 102 Walk in clinic. RN CM advised that Sickle Cell Clinic had agreed to see patients who did not have a Primary MD assignment but this does not mean that patient has Sickle Cell.    H/o Discharge date was  08/19/15. H/o appointment arranged  with [facility] since they are seeing patients for the [facility] at this time. RN CM reviewed recent CM Discharge note:  H/o Pt does not have PCP or insurance. Patient is followed by Benjamine Mola with [facility] financial planning.    Other Dx: HTN/uncontrolled, hyperlipidemia, Dementia.   Medications: Denies any issues; taking as ordered and has all medications available. Meds administered by family.  H/o no insurance for medication coverage.[place] SW ordered at discharge on 08/19/15.   Consent: Daughter/Barry states patient has confusion and will not be able to complete calls.  Daughter agrees to [facility] Stroke Program with [facility].   Plan:  RN CM advised RN CM will follow-up with Sickle Cell Clinic tomorrow for clarification.  Per Epic MR: appt scheduled with Barry Dew, FNP, (Dr. Lou Miner) Sickle Cell Clinic on 09/05/15.  RN CM will contact Dr. Elder Cyphers office for clarification on Sickle Cell Clinic Appt. Designation.   Stroke RN CM provided education on signs and symptoms of Stroke. RN CM provided education on importance of BP management  RN CM provided education on importance of logging BP readings to take to all MD appts.  RN CM mailed  weight and BP log sheet 08/22/15.  RN CM provided education on secondary prevention and mailed EMMI Stroke Education: What You Can Do To prevent A Second Stroke on 08/22/15 (RN CM will plan to review on next contact call.)   Medications:  RN CM will continue to assess for adherence and if patient is getting all medications due to no insurance.  RN CM will explore cost of  meds on next contact call.  [place] service SW ordered at discharge on 08/19/15.  RN CM contacted [facility] 08/25/15 and requested call back from SW with update on services due to not sure if current SW needs are being met and covered via indigent program and progress on [place] application. (No call received back from SW).  RN CM identifies need for Financial assessment, Medicaid  eligibility assessment, and resources. RN CM will attempt 2nd outreach call to [facility] SW.    RN CM will schedule for next [name] Week 3 contact call within 1 week.   RN CM advised to please notify MD of any changes in condition prior to scheduled appt's.  RN CM provided contact name and #, 24-hour nurse line # [phone].  RN CM confirmed patient is aware of 911 services for urgent emergency needs.  Mariann Laster, RN, BSN, [facility], CCM  [employer] Management Coordinator [phone] Direct [phone] Cell [phone] Office [phone] Fax

## 2015-08-28 NOTE — Patient Outreach (Signed)
[facility]) Care Management  08/28/2015  Barry Owens [facility] 01-Jun-1931 [id number]   Care Coordination Note  Outreach call to Dr. Robert Bellow office to confirm appt for New Patient post hospital discharge.   Office contact confirms appt for 09/05/15 at 9:00 (arrive at 8;45 for paper work).  Patient scheduled to see Hart Rochester, NP.   Location:  [address] [place], [place] [address]  [phone] (which is next to 102 Walk in clinic.   [name], RN, BSN, [facility], CCM  [employer] Management Coordinator [phone] Direct [phone] Cell [phone] Office [phone] Fax

## 2015-08-28 NOTE — Patient Outreach (Signed)
[facility]) Care Management  08/28/2015  TRANQUILINO FISCHLER 1931/09/09 [id number]   Inbound voice message received from daughter, Irving Burton states returning call back to RN CM.  States has questions on where to go for Primary MD appt follow-up.    RN CM will schedule for follow-up call today.   [name], RN, BSN, [facility], CCM  [employer] Management Coordinator [phone] Direct [phone] Cell [phone] Office [phone] Fax

## 2015-08-29 ENCOUNTER — Other Ambulatory Visit: Payer: Self-pay

## 2015-08-29 NOTE — Patient Outreach (Signed)
[facility]) Care Management  08/29/2015  Rhonda Vangieson [facility] 01-25-1931 [id number]   Inbound call received from granddaughter/Philpa.   RN CM confirmed update on 09/05/15 appt details, mailed document with appt details and Medicaid application update. RN CM confirmed stroke/neurology appt schedule in 2 months.   Plan:   RN CM will follow-up with Week # 3 Emmi Stroke contact call within one week.   [name], RN, BSN, [facility], CCM  [employer] Coordinator [phone] Direct [phone] Cell [phone] Office [phone] Fax

## 2015-08-29 NOTE — Patient Outreach (Signed)
[facility]) Care Management  08/29/2015  Mohan Erven [facility] 1930-12-20 [id number]  Outreach call #1 to patient's caregiver/Daughter/[name] to provide clarification on location of patients 09/05/15 appt. Contact not reached.  RN CM left HIPAA compliant voice message requesting call back.   [name], RN, BSN, [facility], CCM  [employer] Management Coordinator [phone] Direct [phone] Cell [phone] Office [phone] Fax

## 2015-08-29 NOTE — Patient Outreach (Signed)
[facility]) Care Management  08/29/2015  Barry Owens [facility] 05/07/1931 [id number]  Care Coordination Note  Outreach call to Dr. Perrin Maltese office contact:  Barry Owens to clarify confusion with patient having instructions to follow-up with Dr. Perrin Maltese and a different appt with the Sickle Cell Clinic.   Barry Owens states wrong appt information was provided to RN CM on 08/28/15 phone contact.   Barry Owens verifies appt is with Barry Owens. [name] FNP, at the Sickle Cell Clinic but this location is not the Sickle Cell Clinic location and Dr. Perrin Maltese does not schedule any appt's.   Plan: RN CM will contact Sickle Cell Clinic to confirm appt and address location for patient's appt and provide update to patient.   Barry Schultz, RN, BSN, [facility], CCM  [employer] [facility] Coordinator [phone] Direct [phone] Cell [phone] Office [phone] Fax

## 2015-08-29 NOTE — Patient Outreach (Signed)
[facility]) Care Management  08/29/2015  Reeves Musick [facility] [birth year] [id number]  Inbound voice mail received from Daughter/[name] requesting RN CM contact patient's granddaughter, [name]  [phone] to provide update.  RN CM outreached but contact not reached.  RN CM left voice message requesting call back.   Plan:   RN CM will mail copy of 09/05/15 appt information with a map to [facility].   [name], RN, BSN, [facility], CCM  [employer] Coordinator [phone] Direct [phone] Cell [phone] Office [phone] Fax

## 2015-08-29 NOTE — Patient Outreach (Signed)
[facility]) Care Management  08/29/2015  Barry Owens [facility] 07-04-1931 [id number]   Care Coordination Note  Outbound call #1 to [place] Worker:  Cathie Olden, Contact #: [phone] H/o patient active with [facility] providing SN, PT, ST and SW services through indigent / charity program. H/o per Epic: Medicaid potential/pending status. RN CM contacted SW regarding SW objectives and services provided to avoid duplication of services and assure patient's needs are being met.  RN CM requested confirmation on Medicaid application and SW progress.  SW, Joelene Millin states one SW visit was made following hospital discharge to follow-up that Medicaid application had been filed.  States Daughter reported she thought the hospital had completed the application but was not sure.  SW, Joelene Millin advised daughter she could go to the Department of Social Services to confirm.  Joelene Millin states patient was only on 3 medications and had those meds.  SW, Joelene Millin states no further SW visits are planned and services completed.   Plan: RN CM will contact [place] for clarification on Medicaid Application initiation prior to discharge.   RN CM will send [facility] SW referral if application has not been initiated.    Mariann Laster, RN, BSN, [facility], CCM  [employer] Management Coordinator [phone] Direct [phone] Cell [phone] Office [phone] Fax

## 2015-08-29 NOTE — Patient Outreach (Signed)
[facility]) Care Management  08/29/2015  Davione Lenker [facility] [birth year] [id number]  Care Coordination Note  Outreach call to following provider:   Massie Maroon, NP [facility] Cell Center [address] [place], [place] [address]  [phone]   Office contact confirms that patient has appt on 09/05/15 at the above location at 8:45am.   Plan:  RN CM will schedule follow-up call to patient/caregiver to provide update.  RN CM will clarify address with patient/caregiver as they have wrong address as [address]   [name], RN, BSN, [facility], CCM  [employer] Coordinator [phone] Direct [phone] Cell [phone] Office [phone] Fax

## 2015-08-29 NOTE — Patient Outreach (Signed)
[place] [facility]) Care Management  08/29/2015  Jashon Ishida [facility] 1931-08-07 [id number]   Care Coordination Note  Outbound call #2 to [place] [phone] Contact: Chastity who provides SW contact information. SW Assignment:   Cathie Olden, Contact #:  [phone] H/o patient active with [facility] providing SN, PT, ST and SW services through indigent / charity program. H/o per Epic: Medicaid potential/pending status. RN CM advised RN CM would like update back from SW regarding SW objectives and services provided to avoid duplication of services and assure patient's needs are being met.  RN CM requested confirmation on Medicaid application and SW progress requested.    Mariann Laster, RN, BSN, [facility], CCM  [employer] Management Coordinator [phone] Direct [phone] Cell [phone] Office [phone] Fax

## 2015-08-29 NOTE — Patient Outreach (Signed)
[facility]) Care Management  08/29/2015  Barry Owens 1931-03-16 [id number]  Care Coordination Note  Outreach call to [facility] Financial Counselor, Frances Maywood to follow-up on status of Medicaid application.  Arline Asp states Emergency Medicaid application filed on 08/22/15.  States will be processed through [employer] seeking approval.  Processing time will take 45 days for this Emergency application.  Patient should receive update within 45 days.  [facility] worker will follow and no further needs at this time.    Plan: RN CM will provide update to family. RN CM will not need [facility] SW referral at this time.   [name], RN, BSN, [facility], CCM  [employer] Coordinator [phone] Direct [phone] Cell [phone] Office [phone] Fax

## 2015-09-01 ENCOUNTER — Ambulatory Visit: Payer: Self-pay

## 2015-09-01 NOTE — Patient Outreach (Signed)
[facility]) Care Management  09/01/2015  BAYANI RENTERIA 08-15-1931 [id number]   RED on EMMI Stroke Dashboard, notification sent to [name], RN.  Thanks, Corrie Mckusick. Sharlee Blew [facility] Care Management [facility] CM Assistant Phone: [phone] Fax: [phone]

## 2015-09-03 ENCOUNTER — Telehealth: Payer: Self-pay | Admitting: Neurology

## 2015-09-03 ENCOUNTER — Other Ambulatory Visit: Payer: Self-pay

## 2015-09-03 NOTE — Patient Outreach (Signed)
[facility]) Care Management  09/03/2015  BRAXDEN LOVERING 05-11-1931 [id number]   RED on EMMI Stroke dashboard, notification sent to [name], RN.  Thanks, Corrie Mckusick. Sharlee Blew [facility] Care Management [facility] CM Assistant Phone: [phone] Fax: [phone]

## 2015-09-03 NOTE — Telephone Encounter (Signed)
[facility] is wanting to have an order to continue to monitor the pt. The pt has had some elevated b/p's . Pt is also establishing a PCP tomorrow. [phone] Donita.

## 2015-09-03 NOTE — Patient Outreach (Signed)
[facility]) Care Management  09/03/2015  AGUSTIN SWATEK November 11, 1931 [id number]   [name] (Week 3) Red on Emmi Stroke Dashboard 09/02/15 regarding went to follow-up appt? No  Outreach call to patient and daughter/[name].  Patient not reached.  H/o patient does not have follow-up appt until 09/05/15.  RN CM will review again on next contact call with patient.  RN CM left HIPAA compliant voice message with name and # requesting call back.  RN CM will reschedule for next outreach call within 1-3 days.   [name], RN, BSN, [facility], CCM  [employer] Coordinator [phone] Direct [phone] Cell [phone] Office [phone] Fax

## 2015-09-04 ENCOUNTER — Other Ambulatory Visit: Payer: Self-pay

## 2015-09-04 NOTE — Telephone Encounter (Signed)
Rn call Donita from [facility] at [phone]. Rn explain that per Dr.Xu he is giving a order to monitor patients blood pressure. Rn explain to [place] that patient has appt in 10-21-15 and we can monitor until that time. Rn also informed Donita that after 10-21-15 the patient will need to follow up with a PCP for monitor of his blood pressure. Donita stated the family is establishing a primary doctor this week.

## 2015-09-04 NOTE — Telephone Encounter (Signed)
LFt vm for Barry Owens to call back on order to monitor blood pressure. Pt has an appt in November with hospital follow up.

## 2015-09-04 NOTE — Patient Outreach (Signed)
[facility]) Care Management  09/04/2015  Barry Owens September 10, 1931 [id number]  [name] (Week 3) Red on Emmi Stroke Dashboard 09/02/15 regarding went to follow-up appt? No Daughter confirms No appt's have been missed thus far.   States patient is doing well and showing progress with HH ST services.  Daughter verbalized understanding of symptoms of stroke and understanding that 911 should be called should patient show symptoms.   Plan:  RN CM reviewed next appt details again for 09/05/15.  Daughter verbalized understanding of instructions.   RN CM will schedule for 1 week follow up (Week 4 [name])  [name], RN, BSN, [facility], CCM  [employer] [employer] Coordinator [phone] Direct [phone] Cell [phone] Office [phone] Fax

## 2015-09-04 NOTE — Telephone Encounter (Signed)
Donita with [facility] is returning your call.

## 2015-09-05 ENCOUNTER — Encounter: Payer: Self-pay | Admitting: Family Medicine

## 2015-09-05 ENCOUNTER — Ambulatory Visit (INDEPENDENT_AMBULATORY_CARE_PROVIDER_SITE_OTHER): Payer: Self-pay | Admitting: Family Medicine

## 2015-09-05 ENCOUNTER — Other Ambulatory Visit: Payer: Self-pay | Admitting: Family Medicine

## 2015-09-05 VITALS — BP 129/80 | HR 65 | Temp 98.2°F | Resp 16 | Ht 71.0 in | Wt 228.0 lb

## 2015-09-05 DIAGNOSIS — R7309 Other abnormal glucose: Secondary | ICD-10-CM

## 2015-09-05 DIAGNOSIS — R519 Headache, unspecified: Secondary | ICD-10-CM

## 2015-09-05 DIAGNOSIS — R21 Rash and other nonspecific skin eruption: Secondary | ICD-10-CM

## 2015-09-05 DIAGNOSIS — F0391 Unspecified dementia with behavioral disturbance: Secondary | ICD-10-CM

## 2015-09-05 DIAGNOSIS — M545 Low back pain, unspecified: Secondary | ICD-10-CM | POA: Insufficient documentation

## 2015-09-05 DIAGNOSIS — H539 Unspecified visual disturbance: Secondary | ICD-10-CM

## 2015-09-05 DIAGNOSIS — Z8673 Personal history of transient ischemic attack (TIA), and cerebral infarction without residual deficits: Secondary | ICD-10-CM

## 2015-09-05 DIAGNOSIS — I10 Essential (primary) hypertension: Secondary | ICD-10-CM

## 2015-09-05 DIAGNOSIS — F0390 Unspecified dementia without behavioral disturbance: Secondary | ICD-10-CM | POA: Insufficient documentation

## 2015-09-05 DIAGNOSIS — R7303 Prediabetes: Secondary | ICD-10-CM

## 2015-09-05 DIAGNOSIS — R51 Headache: Secondary | ICD-10-CM

## 2015-09-05 LAB — POCT URINALYSIS DIP (DEVICE)
Bilirubin Urine: NEGATIVE
GLUCOSE, UA: NEGATIVE mg/dL
Ketones, ur: NEGATIVE mg/dL
LEUKOCYTES UA: NEGATIVE
NITRITE: NEGATIVE
Protein, ur: NEGATIVE mg/dL
Specific Gravity, Urine: 1.01 (ref 1.005–1.030)
UROBILINOGEN UA: 0.2 mg/dL (ref 0.0–1.0)
pH: 6 (ref 5.0–8.0)

## 2015-09-05 LAB — BASIC METABOLIC PANEL
BUN: 7 mg/dL (ref 7–25)
CALCIUM: 8.7 mg/dL (ref 8.6–10.3)
CHLORIDE: 102 mmol/L (ref 98–110)
CO2: 27 mmol/L (ref 20–31)
Creat: 0.89 mg/dL (ref 0.70–1.11)
GLUCOSE: 85 mg/dL (ref 65–99)
POTASSIUM: 3.4 mmol/L — AB (ref 3.5–5.3)
SODIUM: 139 mmol/L (ref 135–146)

## 2015-09-05 MED ORDER — DONEPEZIL HCL 5 MG PO TABS: 5.0000 mg | ORAL_TABLET | Freq: Every day | ORAL | Status: DC

## 2015-09-05 MED ORDER — CLOTRIMAZOLE-BETAMETHASONE 1-0.05 % EX CREA: 1.0000 "application " | TOPICAL_CREAM | Freq: Two times a day (BID) | CUTANEOUS | Status: DC

## 2015-09-05 NOTE — Progress Notes (Signed)
Subjective:    Patient ID: Barry Owens, male    DOB: 1930/12/22, 81 y.o.   MRN: [id number]  HPI Barry Owens, an 81 year old male presents to establish care accompanied by wife and granddaughter. Patient was recently hospitalized from 9/10-9/13 following a left hemorrhagic stroke. Patient presented to the emergency room after several days of visual impairments. He had also been experiencing cognitive impairment over the past several months. Barry Owens granddaughter states that he continues to have cognitive impairment and forgetfulness. He is requiring increased family supervision. The patient reports difficulty with vision and daily headaches. Patient is unable to describe daily headache pain.  Patient and family denies fatigue, falls, numbness or tingling to extremities. He is scheduled to follow up with Dr. Roda Shutters, neurologist in November.   Patient also has a history of hypertension. Patient's granddaughter states that it has been controlled at home. His granddaughter states that he is following a low sodium diet. He is also taking anti-hypertensive medications consistently.   Patient is complaining of low back pain. States that pain has been present for several months. Pain is unrelieved by current medication regimen. He denies bowel or bladder incontinence or sciatica.  Past Medical History  Diagnosis Date  . Stroke   . Hypertension    Social History   Social History  . Marital Status: Married    Spouse Name: N/A  . Number of Children: N/A  . Years of Education: N/A   Occupational History  . Not on file.   Social History Main Topics  . Smoking status: Former Smoker    Types: Cigarettes    Quit date: 04/30/1987  . Smokeless tobacco: Not on file  . Alcohol Use: No  . Drug Use: No  . Sexual Activity: Not on file   Other Topics Concern  . Not on file   Social History Narrative     Medication List       This list is accurate as of: 09/05/15 11:01 AM.   Always use your most recent med list.               acetaminophen 650 MG CR tablet  Commonly known as:  TYLENOL  Take 1,300 mg by mouth every 8 (eight) hours as needed for pain.     clotrimazole-betamethasone cream  Commonly known as:  LOTRISONE  Apply 1 application topically 2 (two) times daily.     donepezil 5 MG tablet  Commonly known as:  ARICEPT  Take 1 tablet (5 mg total) by mouth at bedtime.     furosemide 20 MG tablet  Commonly known as:  LASIX  Take 1 tablet (20 mg total) by mouth daily.     lisinopril 20 MG tablet  Commonly known as:  PRINIVIL,ZESTRIL  Take 1 tablet (20 mg total) by mouth 2 (two) times daily.     multivitamin tablet  Take 1 tablet by mouth daily.     pravastatin 20 MG tablet  Commonly known as:  PRAVACHOL  Take 1 tablet (20 mg total) by mouth daily at 6 PM.       Review of Systems  Constitutional: Negative for fever.  Eyes: Positive for redness and visual disturbance. Negative for photophobia.  Respiratory: Negative.  Negative for shortness of breath.   Cardiovascular: Negative.   Gastrointestinal: Negative.   Endocrine: Negative for polydipsia, polyphagia and polyuria.  Genitourinary: Negative.  Negative for urgency, flank pain and difficulty urinating.  Musculoskeletal: Positive for back pain and neck  pain.  Skin:       Itching to right foot  Allergic/Immunologic: Negative.   Neurological: Positive for headaches. Negative for tremors, weakness and numbness.  Hematological: Negative.   Psychiatric/Behavioral: Positive for confusion and decreased concentration. Negative for hallucinations and sleep disturbance. The patient is not hyperactive.        Granddaughter states that he becomes fixated on specific tasks or events.        Objective:   Physical Exam  Constitutional: He is oriented to person, place, and time. He appears well-developed and well-nourished.  HENT:  Head: Normocephalic and atraumatic.  Right Ear: External ear  normal.  Left Ear: External ear normal.  Mouth/Throat: Oropharynx is clear and moist.  Eyes: Conjunctivae and EOM are normal. Pupils are equal, round, and reactive to light.  Neck: Normal range of motion. Neck supple.  Cardiovascular: Normal rate, regular rhythm, normal heart sounds and intact distal pulses.   Pulmonary/Chest: Effort normal and breath sounds normal.  Abdominal: Soft. Bowel sounds are normal.  Musculoskeletal: Normal range of motion.  Neurological: He is alert and oriented to person, place, and time. He has normal reflexes.  Skin: Skin is warm and dry.  Psychiatric: He has a normal mood and affect. His behavior is normal. Judgment and thought content normal.       BP 129/80 mmHg  Pulse 65  Temp(Src) 98.2 F (36.8 C) (Oral)  Resp 16  Ht  (1.803 m)  Wt 228 lb (103.42 kg)  BMI 31.81 kg/m2 Assessment & Plan:   1. History of stroke Patient was recently hospitalized for a stroke with a left brain hemorrhage. Reviewed MRI of head.  Patient is to follow-up with neurology as scheduled.   2. Nonintractable headache, unspecified chronicity pattern, unspecified headache type Patient continues to have daily headaches that are unrelieved by Tylenol. Recommend scheduling an earlier follow-up with Dr. Roda Shutters, neurologist.   3. Visual disturbance Unable to perform Snellen Eye Test. Will send referral to opthalmology for occasional blurred vision.   4. Essential hypertension Blood pressure is at goal on current medication regimen. Will check for proteinuria - POCT urinalysis dipstick  5. Hypocalcemia Reviewed previous labs, Calcium 8.5, which is decreased, will re-check level.  - Basic Metabolic Panel  6. Low back pain without sciatica, unspecified back pain laterality Patient denies bladder or stool incontinence.  Continue Tylenol as previously prescribed for back pain. Recommend ice packs 20 minutes 4 times per day followed by warm, moist compresses as needed.  -  PSA   7. Dementia, with behavioral disturbance  Family reports cognitive impairment. Patient responded to commands during appointment. Performed MMSE (minimental state examination) Patient scored 20, which is consistent with mild cognitive impairment. Will start a 6 week course of Aricept. Discussed medication and side effects with family, expressed understanding.  - donepezil (ARICEPT) 5 MG tablet; Take 1 tablet (5 mg total) by mouth at bedtime.  Dispense: 42 tablet; Refill: 0  8. Rash and nonspecific skin eruption  - clotrimazole-betamethasone (LOTRISONE) cream; Apply 1 application topically 2 (two) times daily.  Dispense: 45 g; Refill: 3 9. Prediabetes  Reviewed labs, hemoglobin a1C is 6.0. Discussed diet at length.     The patient was given clear instructions to go to ER or return to medical center if symptoms do not improve, worsen or new problems develop. The patient verbalized understanding. Will notify patient with laboratory results. Massie Maroon, FNP

## 2015-09-05 NOTE — Patient Instructions (Addendum)
Continue Tylenol as previously prescribed for headache pain. Recommend an earlier appointment with Dr. Roda Shutters, neurologist Low back pain: Ice pack to lower back 20 minutes 4 times per day followed by warm, moist compresses as needed. Recommend a low carbohydrate diet divided over 5-6 small meals. Increase water intake to 3-4 glasses per day Read written information pertaining to low carbohydrate diet Utilize Lotrisone on right 5th toe Will start a 6 week trial of aricept for dementia.  Will send referral to opthalmologist Dementia Dementia is a general term for problems with brain function. A person with dementia has memory loss and a hard time with at least one other brain function such as thinking, speaking, or problem solving. Dementia can affect social functioning, how you do your job, your mood, or your personality. The changes may be hidden for a long time. The earliest forms of this disease are usually not detected by family or friends. Dementia can be:  Irreversible.  Potentially reversible.  Partially reversible.  Progressive. This means it can get worse over time. CAUSES  Irreversible dementia causes may include:  Degeneration of brain cells (Alzheimer disease or Lewy body dementia).  Multiple small strokes (vascular dementia).  Infection (chronic meningitis or Creutzfeldt-Jakob disease).  Frontotemporal dementia. This affects younger people, age 69 to 37, compared to those who have Alzheimer disease.  Dementia associated with other disorders like Parkinson disease, Huntington disease, or HIV-associated dementia. Potentially or partially reversible dementia causes may include:  Medicines.  Metabolic causes such as excessive alcohol intake, vitamin B12 deficiency, or thyroid disease.  Masses or pressure in the brain such as a tumor, blood clot, or hydrocephalus. SIGNS AND SYMPTOMS  Symptoms are often hard to detect. Family members or coworkers may not notice them early in  the disease process. Different people with dementia may have different symptoms. Symptoms can include:  A hard time with memory, especially recent memory. Long-term memory may not be impaired.  Asking the same question multiple times or forgetting something someone just said.  A hard time speaking your thoughts or finding certain words.  A hard time solving problems or performing familiar tasks (such as how to use a telephone).  Sudden changes in mood.  Changes in personality, especially increasing moodiness or mistrust.  Depression.  A hard time understanding complex ideas that were never a problem in the past. DIAGNOSIS  There are no specific tests for dementia.   Your health care provider may recommend a thorough evaluation. This is because some forms of dementia can be reversible. The evaluation will likely include a physical exam and getting a detailed history from you and a family member. The history often gives the best clues and suggestions for a diagnosis.  Memory testing may be done. A detailed brain function evaluation called neuropsychologic testing may be helpful.  Lab tests and brain imaging (such as a CT scan or MRI scan) are sometimes important.  Sometimes observation and re-evaluation over time is very helpful. TREATMENT  Treatment depends on the cause.   If the problem is a vitamin deficiency, it may be helped or cured with supplements.  For dementias such as Alzheimer disease, medicines are available to stabilize or slow the course of the disease. There are no cures for this type of dementia.  Your health care provider can help direct you to groups, organizations, and other health care providers to help with decisions in the care of you or your loved one. HOME CARE INSTRUCTIONS The care of individuals with dementia  is varied and dependent upon the progression of the dementia. The following suggestions are intended for the person living with, or caring for, the  person with dementia.  Create a safe environment.  Remove the locks on bathroom doors to prevent the person from accidentally locking himself or herself in.  Use childproof latches on kitchen cabinets and any place where cleaning supplies, chemicals, or alcohol are kept.  Use childproof covers in unused electrical outlets.  Install childproof devices to keep doors and windows secured.  Remove stove knobs or install safety knobs and an automatic shut-off on the stove.  Lower the temperature on water heaters.  Label medicines and keep them locked up.  Secure knives, lighters, matches, power tools, and guns, and keep these items out of reach.  Keep the house free from clutter. Remove rugs or anything that might contribute to a fall.  Remove objects that might break and hurt the person.  Make sure lighting is good, both inside and outside.  Install grab rails as needed.  Use a monitoring device to alert you to falls or other needs for help.  Reduce confusion.  Keep familiar objects and people around.  Use night lights or dim lights at night.  Label items or areas.  Use reminders, notes, or directions for daily activities or tasks.  Keep a simple, consistent routine for waking, meals, bathing, dressing, and bedtime.  Create a calm, quiet environment.  Place large clocks and calendars prominently.  Display emergency numbers and home address near all telephones.  Use cues to establish different times of the day. An example is to open curtains to let the natural light in during the day.   Use effective communication.  Choose simple words and short sentences.  Use a gentle, calm tone of voice.  Be careful not to interrupt.  If the person is struggling to find a word or communicate a thought, try to provide the word or thought.  Ask one question at a time. Allow the person ample time to answer questions. Repeat the question again if the person does not  respond.  Reduce nighttime restlessness.  Provide a comfortable bed.  Have a consistent nighttime routine.  Ensure a regular walking or physical activity schedule. Involve the person in daily activities as much as possible.  Limit napping during the day.  Limit caffeine.  Attend social events that stimulate rather than overwhelm the senses.  Encourage good nutrition and hydration.  Reduce distractions during meal times and snacks.  Avoid foods that are too hot or too cold.  Monitor chewing and swallowing ability.  Continue with routine vision, hearing, dental, and medical screenings.  Give medicines only as directed by the health care provider.  Monitor driving abilities. Do not allow the person to drive when safe driving is no longer possible.  Register with an identification program which could provide location assistance in the event of a missing person situation. SEEK MEDICAL CARE IF:   New behavioral problems start such as moodiness, aggressiveness, or seeing things that are not there (hallucinations).  Any new problem with brain function happens. This includes problems with balance, speech, or falling a lot.  Problems with swallowing develop.  Any symptoms of other illness happen. Small changes or worsening in any aspect of brain function can be a sign that the illness is getting worse. It can also be a sign of another medical illness such as infection. Seeing a health care provider right away is important. SEEK IMMEDIATE MEDICAL CARE  IF:   A fever develops.  New or worsened confusion develops.  New or worsened sleepiness develops.  Staying awake becomes hard to do. Document Released: 05/18/2001 Document Revised: 04/08/2014 Document Reviewed: 04/19/2011 [facility] Patient Information 2015 [place], [place]. This information is not intended to replace advice given to you by your health care provider. Make sure you discuss any questions you have with your health  care provider. Donepezil tablets What is this medicine? DONEPEZIL (doe NEP e zil) is used to treat mild to moderate dementia caused by Alzheimer's disease. This medicine may be used for other purposes; ask your health care provider or pharmacist if you have questions. COMMON BRAND NAME(S): Aricept What should I tell my health care provider before I take this medicine? They need to know if you have any of these conditions: -asthma or other lung disease -difficulty passing urine -head injury -heart disease -history of irregular heartbeat -liver disease -seizures (convulsions) -stomach or intestinal disease, ulcers or stomach bleeding -an unusual or allergic reaction to donepezil, other medicines, foods, dyes, or preservatives -pregnant or trying to get pregnant -breast-feeding How should I use this medicine? Take this medicine by mouth with a glass of water. Follow the directions on the prescription label. You may take this medicine with or without food. Take this medicine at regular intervals. This medicine is usually taken before bedtime. Do not take it more often than directed. Continue to take your medicine even if you feel better. Do not stop taking except on your doctor's advice. If you are taking the 23 mg donepezil tablet, swallow it whole; do not cut, crush, or chew it. Talk to your pediatrician regarding the use of this medicine in children. Special care may be needed. Overdosage: If you think you have taken too much of this medicine contact a poison control center or emergency room at once. NOTE: This medicine is only for you. Do not share this medicine with others. What if I miss a dose? If you miss a dose, take it as soon as you can. If it is almost time for your next dose, take only that dose, do not take double or extra doses. What may interact with this medicine? Do not take this medicine with any of the following medications: -certain medicines for fungal infections like  itraconazole, fluconazole, posaconazole, and voriconazole -cisapride -dextromethorphan; quinidine -dofetilide -dronedarone -pimozide -quinidine -thioridazine -ziprasidone This medicine may also interact with the following medications: -antihistamines for allergy, cough and cold -atropine -bethanechol -carbamazepine -certain medicines for bladder problems like oxybutynin, tolterodine -certain medicines for Parkinson's disease like benztropine, trihexyphenidyl -certain medicines for stomach problems like dicyclomine, hyoscyamine -certain medicines for travel sickness like scopolamine -dexamethasone -ipratropium -NSAIDs, medicines for pain and inflammation, like ibuprofen or naproxen -other medicines for Alzheimer's disease -other medicines that prolong the QT interval (cause an abnormal heart rhythm) -phenobarbital -phenytoin -rifampin, rifabutin or rifapentine This list may not describe all possible interactions. Give your health care provider a list of all the medicines, herbs, non-prescription drugs, or dietary supplements you use. Also tell them if you smoke, drink alcohol, or use illegal drugs. Some items may interact with your medicine. What should I watch for while using this medicine? Visit your doctor or health care professional for regular checks on your progress. Check with your doctor or health care professional if your symptoms do not get better or if they get worse. You may get drowsy or dizzy. Do not drive, use machinery, or do anything that needs mental alertness until you  know how this drug affects you. What side effects may I notice from receiving this medicine? Side effects that you should report to your doctor or health care professional as soon as possible: -allergic reactions like skin rash, itching or hives, swelling of the face, lips, or tongue -changes in vision -feeling faint or lightheaded, falls -problems with balance -slow heartbeat, or  palpitations -stomach pain -unusual bleeding or bruising, red or purple spots on the skin -vomiting -weight loss Side effects that usually do not require medical attention (report to your doctor or health care professional if they continue or are bothersome): -diarrhea, especially when starting treatment -headache -indigestion or heartburn -loss of appetite -muscle cramps -nausea This list may not describe all possible side effects. Call your doctor for medical advice about side effects. You may report side effects to FDA at 1-800-FDA-1088. Where should I keep my medicine? Keep out of reach of children. Store at room temperature between 15 and 30 degrees C (59 and 86 degrees F). Throw away any unused medicine after the expiration date. NOTE: This sheet is a summary. It may not cover all possible information. If you have questions about this medicine, talk to your doctor, pharmacist, or health care provider.  2015, Elsevier/[employer] Standard. (2014-03-11 21:44:11)

## 2015-09-06 LAB — PSA: PSA: 3.14 ng/mL (ref <=4.00)

## 2015-09-08 ENCOUNTER — Telehealth: Payer: Self-pay | Admitting: Family Medicine

## 2015-09-08 NOTE — Telephone Encounter (Signed)
Left message advising of slightly low potassium and to include potassium rich foods in diet. Advised of all other labs normal and to keep follow up as previously scheduled. Asked if any questions to call back and left call back information. Thanks !

## 2015-09-08 NOTE — Telephone Encounter (Signed)
Reviewed labs, potassium mildly decreased at 3.4. Recommend diet rich in potassium such as bananas, nectarines, raisins, orange juice, or green, leafy veggies.   All other labs within normal parameters. Follow up as scheduled.    Massie Maroon, FNP

## 2015-09-10 ENCOUNTER — Telehealth: Payer: Self-pay | Admitting: Neurology

## 2015-09-10 ENCOUNTER — Telehealth: Payer: Self-pay

## 2015-09-10 DIAGNOSIS — R300 Dysuria: Secondary | ICD-10-CM

## 2015-09-10 NOTE — Telephone Encounter (Signed)
Pain when he pees.    [phone]  [name].  Nurse with [facility].

## 2015-09-10 NOTE — Telephone Encounter (Signed)
Called Barry Owens back at [facility] and advised that pt should go see his PCP for urinary problems. Dr. Roda Shutters is following pt on 11/15 for a stroke f/u. He needs to contact his PCP asap to get evaluated. Dr Roda Shutters has not seen pt in our office yet. He verbalized understanding and stated he is going to call the wife and let her know. Wife stated symptoms started yesterday.

## 2015-09-10 NOTE — Telephone Encounter (Signed)
[name]/[facility] [phone] called to advise he saw patient today and wife advised him that patient has had increase in urinary frequency, going every hour, also burns when urinates and urine is brown in color.

## 2015-09-11 ENCOUNTER — Ambulatory Visit (INDEPENDENT_AMBULATORY_CARE_PROVIDER_SITE_OTHER): Payer: Self-pay | Admitting: Family Medicine

## 2015-09-11 ENCOUNTER — Other Ambulatory Visit: Payer: Self-pay

## 2015-09-11 ENCOUNTER — Ambulatory Visit: Payer: Self-pay

## 2015-09-11 ENCOUNTER — Telehealth: Payer: Self-pay | Admitting: Neurology

## 2015-09-11 ENCOUNTER — Ambulatory Visit (INDEPENDENT_AMBULATORY_CARE_PROVIDER_SITE_OTHER): Payer: Self-pay

## 2015-09-11 VITALS — BP 152/90 | HR 78 | Temp 98.9°F | Resp 17 | Ht 72.0 in | Wt 223.0 lb

## 2015-09-11 DIAGNOSIS — R309 Painful micturition, unspecified: Secondary | ICD-10-CM

## 2015-09-11 DIAGNOSIS — R350 Frequency of micturition: Secondary | ICD-10-CM

## 2015-09-11 DIAGNOSIS — R319 Hematuria, unspecified: Secondary | ICD-10-CM

## 2015-09-11 DIAGNOSIS — N419 Inflammatory disease of prostate, unspecified: Secondary | ICD-10-CM

## 2015-09-11 LAB — POC MICROSCOPIC URINALYSIS (UMFC): MUCUS RE: ABSENT

## 2015-09-11 LAB — POCT URINALYSIS DIP (MANUAL ENTRY)
BILIRUBIN UA: NEGATIVE
BILIRUBIN UA: NEGATIVE
GLUCOSE UA: NEGATIVE
Nitrite, UA: NEGATIVE
Protein Ur, POC: 100 — AB
SPEC GRAV UA: 1.015
UROBILINOGEN UA: 2
pH, UA: 7

## 2015-09-11 MED ORDER — CIPROFLOXACIN HCL 500 MG PO TABS: 500.0000 mg | ORAL_TABLET | Freq: Two times a day (BID) | ORAL | Status: DC

## 2015-09-11 NOTE — Telephone Encounter (Signed)
The nurse would like an order to check pt's urine. (urianlysis and culture) He is having urinary frequency and dysuria. Fax over to [phone]

## 2015-09-11 NOTE — Patient Outreach (Signed)
[facility]) Care Management  09/11/2015  Jaquaveon Bilal [facility] Mar 21, 1931 [id number]   Telephonic Care Coordination Note  Contact call to patient's daughter/PCG/[name].  Issue:  Update on the following Primary Care MD appt scheduled for Monday October 10, at 1:15 pm. F/u on Hematuria RN CM advised to use urgent care if needed prior to that appt time.   Aricept prescription has been called to pharmacy and is ready for pick-up.  RN CM advised to pick up at pharmacy and administer as directed.   Medicaid RN CM confirmed that Medicaid status remains pending.  Family has received no updates since application filed on 08/22/15 with 45 day waiting period for outcome.   RN CM encouraged to contact RN CM as needed for questions / concerns.  RN CM advised in next follow-up call in one week on outcomes regarding the above issues and [name] closure.  RN CM will assess for barriers due to self pay status at this time prior to closure.   [name], RN, BSN, [facility], CCM  [facility] [employer] [facility] Coordinator [phone] Direct [phone] Cell [phone] Office [phone] Fax

## 2015-09-11 NOTE — Patient Outreach (Signed)
[facility]) Care Management  09/11/2015  Barry Owens [facility] [birth year] [id number]   Telephonic Care Management Note [name]  Stroke Patients caregiver/daughter/[name] reached.   Barry Owens states patient completed Primary MD appt 09/05/15 which went well.  States patient is having no mobility issues and completed speech therapy last week.  States issue with memory related to dementia.  NP discussed Aricept trail but wanted to get approved by MD prior to prescribing.  Barry Owens is not sure of what the outcome was to this decision.    Barry Owens states NEW finding this morning.  States patient had much difficulty voiding with pain.  Urine was very bloody with appearance of fresh blood for unknown reason.  Family contacted Primary MD to report change in condition but MD office does not have any appt openings in 1-2 days and advised to use urgent care as needed.   Barry Owens asked RN CM what should she do.  States patient is unable to describe pain very well due tho his dementia.     Plan:  Stroke RN CM will contact MD or pharmacy for verification.  MR review indicates Aricept has been added to medication list.  RN CM will update Barry Owens with outcome.   Hematuria RN CM confirmed no known knowledge of past kidney stones; patient is not on ASA or other blood thinner.   RN CM encouraged daughter that bright red bleeding is abnormal finding and needs follow-up care.   RN CM encouraged daughter to make MD appt and use good judgement on need to go to urgent care if patient continues with any bleeding, pain and inability to void.  Advised patient should follow-up with MD regardless of improvement in symptoms to evaluate reason for occurrence and to insure patient does not have UTI or other cause.   Advised to use Urgent Care if symptoms persist or has another occurrence prior to scheduled MD appt.  RN CM will contact Primary MD office to advocate for urgent MD appt rather than sending patient  to ED.   RN CM instructed Barry Burton RN CM will follow-up on the above issues and report outcomes back to [name].  RN CM advised to please notify MD of any changes in condition prior to scheduled appt's.   RN CM provided contact name and # [phone] or main office # [phone] and 24-hour nurse line # [phone].   RN CM confirmed patient is aware of 911 services for urgent emergency needs.  [name], RN, BSN, [facility], CCM  [facility] [employer] Management Coordinator [phone] Direct [phone] Cell [phone] Office [phone] Fax

## 2015-09-11 NOTE — Patient Outreach (Signed)
[facility]) Care Management  09/11/2015  Barry Owens [facility] 1931-02-09 [id number]   Telephonic Care Coordination Note [name]   Contact call to [id number] - [place], [place] - [phone] Nonda Lou (Pharmacy) [phone] Issue:  Verification that Aricept prescription has been called and ready for pick up.  Outcome:  Yes.   Plan: RN CM will provide phone update to Daughter/Barry Owens.   [name], RN, BSN, [facility], CCM  [facility] [employer] [facility] Coordinator [phone] Direct [phone] Cell [phone] Office [phone] Fax

## 2015-09-11 NOTE — Patient Outreach (Signed)
[facility]) Care Management  09/11/2015  Ansley Stanwood [facility] February 02, 1931 [id number]  Telephonic Care Coordination Note [name]  Outreach call to MD office: Massie Maroon, NP [facility] [address] [place], [place] [address]  [phone]  Issue:   need for urgent MD appt to avoid ED appt to assess change in condition:  Hematuria Contact:  Westley Hummer confirms family called office this morning and no appt available.  RN CM requested 1st available appt ( Monday 1:15pm).  RN CM advised to schedule this appt and patient will follow-up unless patient has to go to Urgent Care prior to this appt.   [name], RN, BSN, [facility], CCM  [facility] [employer] [facility] Coordinator [phone] Direct [phone] Cell [phone] Office [phone] Fax

## 2015-09-11 NOTE — Telephone Encounter (Signed)
Spoke with Geralyn Corwin, patient's home health nurse, that explains that patient is having dysuria and frequency, but no hematuria, fever, N/V. Advised that I will put in an order and he can bring in the urine specimen, however, if patient develops systemic symptoms he should bring patient in. Geralyn Corwin agrees and will bring urine in today.

## 2015-09-11 NOTE — Telephone Encounter (Signed)
I attempted to call daughter.  No answer.  Looking via [facility]IC has seen urgent care, Dr. Alwyn Ren.  Treating for UTI.  Has appt with pcp 09-15-15.  Will try to call later.

## 2015-09-11 NOTE — Telephone Encounter (Signed)
I had a r/s appt to 10/18 and called granddaughter and she happy with that date.

## 2015-09-11 NOTE — Patient Instructions (Signed)
Encourage him to drink lots of water.  Begin the ciprofloxacin 500 mg one pill twice daily  Take acetaminophen (Tylenol) (paracetamol) 500 mg 2 pills 3 times daily if needed for pain  If he is not doing better we may need to try another medicine that relaxes the outlet of the bladder.  If he keeps passing blood might need to refer him to a urologist  Return or go to the emergency room if worse or not improving

## 2015-09-11 NOTE — Progress Notes (Signed)
Patient ID: Barry Owens, male    DOB: Feb 17, 1931  Age: 81 y.o. MRN: [id number]  Chief Complaint  Patient presents with  . Urinary Frequency    since monday   . Hematuria  . Dysuria    Subjective:   81 year old [place] gentleman who is here in the [place] for the last year visiting his daughter. He has had various medical complaints, but most recently he had a stroke back in September. They were told that he had a brain hemorrhage but no aneurysm. He has had some increased confusion from the stroke, and addition to his underlying mild dementia. He is accompanied by his wife and daughter today. He has been having nocturia 3-4 times per night. The last day or so they have seen some blood in his urine. And seen this once before. He did have urinalysis last week which was normal. He has not been having any fever or chills. He complains of low back pain. He had a prostate blood test done recently, but has not had a prostate digital exam done lately.  He is not on any anticoagulation therapy. He does have a history of hypertension.  When he urinates he has a good stream, but at the end of urination he stops and starts before fully emptying.  Current allergies, medications, problem list, past/family and social histories reviewed.  Objective:  BP 152/90 mmHg  Pulse 78  Temp(Src) 98.9 F (37.2 C) (Oral)  Resp 17  Ht 6' (1.829 m)  Wt 223 lb (101.152 kg)  BMI 30.24 kg/m2  SpO2 97%  Elderly gentleman, alert but doesn't speak to me much in [place]. He does know some English, but reverts to speaking his native language is grieving his wife and daughter. He is somewhat generally firm in the abdomen, feels as though he may be constipated. He has had some intermittent constipation. He does not have any major tenderness. No CVA tenderness. His pain in the back seems to be primarily down in the lumbosacral region. Normal male external genitalia. He said it hurt when I examined him but no specific point  could be identified. No hernias. There is a little blood on his depends, but none on the penis. Digital rectal exam was done. It was difficult to get a good exam because of a little resistance. I could not get a good feel of the prostate itself. He does seem to be constipated with large amount stool-rectum. Results for orders placed or performed in visit on 09/11/15  POCT Microscopic Urinalysis ([facility])  Result Value Ref Range   WBC,UR,HPF,POC Few (A) None WBC/hpf   RBC,UR,HPF,POC Moderate (A) None RBC/hpf   Bacteria None None   Mucus Absent Absent   Epithelial Cells, UR Per Microscopy Few (A) None cells/hpf  POCT urinalysis dipstick  Result Value Ref Range   Color, UA red (A) yellow   Clarity, UA cloudy (A) clear   Glucose, UA negative negative   Bilirubin, UA negative negative   Ketones, POC UA negative negative   Spec Grav, UA 1.015    Blood, UA large (A) negative   pH, UA 7.0    Protein Ur, POC =100 (A) negative   Urobilinogen, UA 2.0    Nitrite, UA Negative Negative   Leukocytes, UA small (1+) (A) Negative     Assessment & Plan:   Assessment: 1. Blood in urine   2. Frequent urination   3. Pain with urination   4. Hematuria   5. Prostatitis, unspecified prostatitis type  Plan:  Hematuria, etiology undetermined. Suspicious for the possibility of kidney stone since he does not have quite cells in the urine. However the pain in the midline low back would point toward the possibility of prostatitis.   Orders Placed This Encounter  Procedures  . Urine culture  . DG Abd 1 View    Standing Status: Future     Number of Occurrences: 1     Standing Expiration Date: 11/12/2016    Order Specific Question:  Reason for Exam (SYMPTOM  OR DIAGNOSIS REQUIRED)    Answer:  abdominal pain    Order Specific Question:  Preferred imaging location?    Answer:  External  . POCT Microscopic Urinalysis ([facility])  . POCT urinalysis dipstick    Results for orders placed or performed  in visit on 09/11/15  POCT Microscopic Urinalysis ([facility])  Result Value Ref Range   WBC,UR,HPF,POC Few (A) None WBC/hpf   RBC,UR,HPF,POC Moderate (A) None RBC/hpf   Bacteria None None   Mucus Absent Absent   Epithelial Cells, UR Per Microscopy Few (A) None cells/hpf  POCT urinalysis dipstick  Result Value Ref Range   Color, UA red (A) yellow   Clarity, UA cloudy (A) clear   Glucose, UA negative negative   Bilirubin, UA negative negative   Ketones, POC UA negative negative   Spec Grav, UA 1.015    Blood, UA large (A) negative   pH, UA 7.0    Protein Ur, POC =100 (A) negative   Urobilinogen, UA 2.0    Nitrite, UA Negative Negative   Leukocytes, UA small (1+) (A) Negative   [facility] reading (PRIMARY) by  Dr. Alwyn Ren Calcification ice pelvis, suspicious for a stone in the bladder?   . Contemplated beginning some Flomax, but felt that with his age and health status should only start one thing at a time. If he keeps having pain and if the radiologist feels that the calcification represents a stone in the bladder, it might be worth trying Flomax. However the combination of that and simply might and dizziness in him so we need to be done with cauti   Patient Instructions  Encourage him to drink lots of water.  Begin the ciprofloxacin 500 mg one pill twice daily  Take acetaminophen (Tylenol) (paracetamol) 500 mg 2 pills 3 times daily if needed for pain  If he is not doing better we may need to try another medicine that relaxes the outlet of the bladder.  If he keeps passing blood might need to refer him to a urologist  Return or go to the emergency room if worse or not improving       Return if symptoms worsen or fail to improve.   [name], MD 09/11/2015

## 2015-09-11 NOTE — Telephone Encounter (Addendum)
Pt's granddaughter called stating PCP was concerned with pt's BP and HA's. I r/s appt to 11/4 but she inquiring if pt can be seen sooner. Pt saw Dr Roda Shutters at hospital for stroke. She can be reached at [phone].

## 2015-09-11 NOTE — Telephone Encounter (Signed)
Patient's daughter is returning a call 

## 2015-09-11 NOTE — Telephone Encounter (Signed)
Pt's daughter is calling and would like to know if Dr. Roda Shutters could write a letter stating he is unable to travel due to health reasons. She also states that he might be going to the hospital today due to other factors. May call [phone]

## 2015-09-12 ENCOUNTER — Other Ambulatory Visit: Payer: Self-pay

## 2015-09-12 ENCOUNTER — Telehealth: Payer: Self-pay | Admitting: Family Medicine

## 2015-09-12 NOTE — Telephone Encounter (Signed)
Patient's daughter is asking that Dr. Alwyn Ren call her back. She has questions regarding her father.  [phone]

## 2015-09-12 NOTE — Patient Outreach (Signed)
[facility]) Care Management  09/12/2015  Kaydyn Sayas [facility] November 08, 1931 [id number]  Telephonic Care Coordination Note  Inbound call from PCG/Daughter/[name] with update on symptoms of Hematuria.   Irving Burton states patient developed more pain with voiding yesterday afternoon and had to be taken to the Urgent Care.  States patient's mobility was also worse and had great difficulty getting patient out and in the care.  Reports frequent voiding and getting up 3-4 times / night.  States patient became more confused and even more confused at the Urgent Care.  Fever was up to 102 but states down to 98 this morning.  States BP elevated last night to 184/84 but down to 160/70.   States X-rays and U/A completed.  Waiting for Radiologist to read and confirm findings.  States patient may have obstruction and confirmed UTI.  Cipro ordered.   States H/o Primary MD office calling back yesterday stating they would see patient today at 10:30 but had already taken patient to Urgent Care.   Plan:  RN CM instructed as follows: -notify MD of increased confusion, increased temp, increased BP, inability to void, pain or bleeding.   -take antibiotic as directed until prescription is complete.  -follow-up with primary MD office  RN CM reviewed Dr. Caryl Never instructions: ciprofloxacin 500 mg one pill twice daily acetaminophen (Tylenol) (paracetamol) 500 mg 2 pills 3 times daily if needed for pain -encourage patient to drink lots of water -If continues to pass blood; may need urology referral -Seek ED if symptoms are worse or not improving.   RN CM advised to please notify MD of any changes in condition prior to scheduled appt's.  H/o next PCP appt scheduled for 10/17/15.   RN CM provided contact name and # [phone] / main office # [phone] / 24-hour nurse line # [phone]. RN CM confirmed patient is aware of 911 services for urgent emergency needs. RN CM advised in next follow-call within  one week.   [name], RN, BSN, [facility], CCM  [facility] [employer] [facility] Coordinator [phone] Direct [phone] Cell [phone] Office [phone] Fax

## 2015-09-12 NOTE — Telephone Encounter (Signed)
Called and spoke with Irving Burton. Advised per Dr. Roda Shutters that they need to get the letter through his PCP or Dr. Alwyn Ren. He just saw pt in hospital one month ago for ICH but he thinks PCP or Dr. Alwyn Ren should write the letter since they just saw him yesterday for his health decline. She verbalized understanding.

## 2015-09-12 NOTE — Telephone Encounter (Signed)
This letter should be obtained through PCP or Dr. Alwyn Ren. I saw this pt in hospital one month ago for ICH but I think PCP and Dr. Alwyn Ren just saw him yesterday for his health decline and it is more reasonable for them to write the letter instead. Thanks.  Marvel Plan, MD PhD Stroke Neurology 09/12/2015 10:30 AM

## 2015-09-12 NOTE — Telephone Encounter (Signed)
Pt's daughter called and states that her father's health is declining quickly and would like the letter so her siblings can take it to the [place] in [place] for them to gain visa to come. Letter needs to tell the extent of his illness and why he can not travel. Please call [phone]

## 2015-09-12 NOTE — Patient Outreach (Signed)
[facility]) Care Management  09/12/2015  Bearl Talarico [facility] [birth year] [id number]   Inbound call from patient's PCG/Daughter/[name].   Irving Burton states she needs a MD letter to support her family to travel from [place] to [place] to see their father.  States the family is concerned about patients health changes.  States would need a letter to support the urgent needs of a visit.    RN CM advised Irving Burton to contact Primary MD office and discuss her concerns and needs.  RN CM advised Primary MD is the primary contact to assist with this need.  Irving Burton verbalized understanding and stated she would contact primary MD.   [name], RN, BSN, [facility], CCM  [facility] Care Management Coordinator [phone] Direct [phone] Cell [phone] Office [phone] Fax

## 2015-09-13 LAB — URINE CULTURE

## 2015-09-15 ENCOUNTER — Ambulatory Visit: Payer: Self-pay | Admitting: Family Medicine

## 2015-09-15 ENCOUNTER — Ambulatory Visit (INDEPENDENT_AMBULATORY_CARE_PROVIDER_SITE_OTHER): Payer: Self-pay | Admitting: Family Medicine

## 2015-09-15 VITALS — BP 158/70 | HR 61 | Temp 97.7°F | Resp 14 | Ht 71.0 in | Wt 224.0 lb

## 2015-09-15 DIAGNOSIS — N390 Urinary tract infection, site not specified: Secondary | ICD-10-CM

## 2015-09-15 DIAGNOSIS — I10 Essential (primary) hypertension: Secondary | ICD-10-CM | POA: Insufficient documentation

## 2015-09-15 MED ORDER — CLONIDINE HCL 0.1 MG PO TABS
0.1000 mg | ORAL_TABLET | Freq: Once | ORAL | Status: AC
  Administered 2015-09-15: 0.1 mg via ORAL

## 2015-09-15 NOTE — Progress Notes (Signed)
Subjective:    Patient ID: KRU ALLMAN, male    DOB: November 26, 1931, 81 y.o.   MRN: [id number]  HPI [name], an 81 year old male presents for follow up of hematuria accompanied by wife and daughter. Family states that patient showed increased cognitive impairment and had blood in his urine for 2 days. Patient was found to have a urinary tract infection and is currently on antibiotic therapy. Urine culture yielded Klebsiella pneumoniae. Patient's wife and daughter state that hematuria and cognitive impairment have improved. Patient denies fever, chills, hematuria, urinary frequency, urgency, or incontinence.   Patient also has a history of uncontrolled hypertension. Blood pressures have been elevated at home. Patient has consistently been taking Lisinopril 20 mg twice daily. His family makes certain that he follows a low sodium diet. He denies headache, dizziness, shortness of breath, chest pains, or dyspnea. Patient is alert, oriented, and smiling.  Past Medical History  Diagnosis Date  . Stroke (HCC)   . Hypertension   . Cataract    Social History   Social History  . Marital Status: Married    Spouse Name: N/A  . Number of Children: N/A  . Years of Education: N/A   Occupational History  . Not on file.   Social History Main Topics  . Smoking status: Former Smoker    Types: Cigarettes    Quit date: 04/30/1987  . Smokeless tobacco: Not on file  . Alcohol Use: No  . Drug Use: No  . Sexual Activity: Not on file   Other Topics Concern  . Not on file   Social History Narrative     Medication List       This list is accurate as of: 09/15/15  1:34 PM.  Always use your most recent med list.               acetaminophen 650 MG CR tablet  Commonly known as:  TYLENOL  Take 1,300 mg by mouth every 8 (eight) hours as needed for pain.     ciprofloxacin 500 MG tablet  Commonly known as:  CIPRO  Take 1 tablet (500 mg total) by mouth 2 (two) times daily.     clotrimazole-betamethasone cream  Commonly known as:  LOTRISONE  Apply 1 application topically 2 (two) times daily.     donepezil 5 MG tablet  Commonly known as:  ARICEPT  Take 1 tablet (5 mg total) by mouth at bedtime.     furosemide 20 MG tablet  Commonly known as:  LASIX  Take 1 tablet (20 mg total) by mouth daily.     lisinopril 20 MG tablet  Commonly known as:  PRINIVIL,ZESTRIL  Take 1 tablet (20 mg total) by mouth 2 (two) times daily.     multivitamin tablet  Take 1 tablet by mouth daily.     pravastatin 20 MG tablet  Commonly known as:  PRAVACHOL  Take 1 tablet (20 mg total) by mouth daily at 6 PM.       Review of Systems  Constitutional: Negative for fever.  Eyes: Positive for redness and visual disturbance. Negative for photophobia.  Respiratory: Negative.  Negative for shortness of breath.   Cardiovascular: Negative.   Gastrointestinal: Negative.   Endocrine: Negative for polydipsia, polyphagia and polyuria.  Genitourinary: Negative.  Negative for urgency, flank pain and difficulty urinating.  Musculoskeletal: Positive for back pain and neck pain.  Skin:       Itching to right foot  Allergic/Immunologic: Negative.  Neurological: Positive for headaches. Negative for tremors, weakness and numbness.  Hematological: Negative.   Psychiatric/Behavioral: Positive for confusion and decreased concentration. Negative for hallucinations and sleep disturbance. The patient is not hyperactive.        Granddaughter states that he becomes fixated on specific tasks or events.        Objective:   Physical Exam  Constitutional: He is oriented to person, place, and time. He appears well-developed and well-nourished.  HENT:  Head: Normocephalic and atraumatic.  Right Ear: External ear normal.  Left Ear: External ear normal.  Mouth/Throat: Oropharynx is clear and moist.  Eyes: Conjunctivae and EOM are normal. Pupils are equal, round, and reactive to light.  Neck: Normal  range of motion. Neck supple.  Cardiovascular: Normal rate, regular rhythm, normal heart sounds and intact distal pulses.   Pulmonary/Chest: Effort normal and breath sounds normal.  Abdominal: Soft. Bowel sounds are normal.  Musculoskeletal: Normal range of motion.  Neurological: He is alert and oriented to person, place, and time. He has normal reflexes.  Skin: Skin is warm and dry.  Psychiatric: He has a normal mood and affect. His behavior is normal. Judgment and thought content normal.       BP 192/62 mmHg  Pulse 61  Temp(Src) 97.7 F (36.5 C) (Oral)  Resp 14  Ht  (1.803 m)  Wt 224 lb (101.606 kg)  BMI 31.26 kg/m2 Assessment & Plan:   1. Uncontrolled hypertension Systolic blood pressure elevated. Patient was given clonidine in office. He was observed for 30 minutes and sent home with family. Patient to continue lisinopril 20 mg BID. Blood pressure decreased. Patient to follow up in 1 week for blood pressure check. I will not add an additional anti-hypertensive medication at this time due to age. Family encouraged to continue to check blood pressure consistently at home.    BP 158/70 mmHg  Pulse 61  Temp(Src) 97.7 F (36.5 C) (Oral)  Resp 14  Ht  (1.803 m)  Wt 224 lb (101.606 kg)  BMI 31.26 kg/m2 - cloNIDine (CATAPRES) tablet 0.1 mg; Take 1 tablet (0.1 mg total) by mouth once.  2. Urinary tract infection without hematuria, site unspecified Reviewed urinalysis, not signs of hematuria at present. Reviewed sensitivity panel, Klebsiella pneumoniae is sensitive to ciprofloxacin. Encouraged patient to complete antibiotic. Also, encouraged to increase water intake.    RTC: 1 week for bp check  The patient was given clear instructions to go to ER or return to medical center if symptoms do not improve, worsen or new problems develop. The patient verbalized understanding.    Massie Maroon, FNP

## 2015-09-16 ENCOUNTER — Ambulatory Visit: Payer: Self-pay

## 2015-09-16 ENCOUNTER — Encounter: Payer: Self-pay | Admitting: Family Medicine

## 2015-09-16 ENCOUNTER — Telehealth: Payer: Self-pay

## 2015-09-16 NOTE — Telephone Encounter (Signed)
Patients daughter, Auther Lyerly states her father ws seen by Dr Alwyn Ren on 09/11/15. She has reviewed her fathers MyChart lab results and she has questions regarding the results She can be reached on her cell phone [phone] or at her work number until 5:00 [phone] ext [phone]

## 2015-09-16 NOTE — Patient Instructions (Signed)
Hypertension Hypertension, commonly called high blood pressure, is when the force of blood pumping through your arteries is too strong. Your arteries are the blood vessels that carry blood from your heart throughout your body. A blood pressure reading consists of a higher number over a lower number, such as 110/72. The higher number (systolic) is the pressure inside your arteries when your heart pumps. The lower number (diastolic) is the pressure inside your arteries when your heart relaxes. Ideally you want your blood pressure below 120/80. Hypertension forces your heart to work harder to pump blood. Your arteries may become narrow or stiff. Having untreated or uncontrolled hypertension can cause heart attack, stroke, kidney disease, and other problems. RISK FACTORS Some risk factors for high blood pressure are controllable. Others are not.  Risk factors you cannot control include:   Race. You may be at higher risk if you are African American.  Age. Risk increases with age.  Gender. Men are at higher risk than women before age 45 years. After age 65, women are at higher risk than men. Risk factors you can control include:  Not getting enough exercise or physical activity.  Being overweight.  Getting too much fat, sugar, calories, or salt in your diet.  Drinking too much alcohol. SIGNS AND SYMPTOMS Hypertension does not usually cause signs or symptoms. Extremely high blood pressure (hypertensive crisis) may cause headache, anxiety, shortness of breath, and nosebleed. DIAGNOSIS To check if you have hypertension, your health care provider will measure your blood pressure while you are seated, with your arm held at the level of your heart. It should be measured at least twice using the same arm. Certain conditions can cause a difference in blood pressure between your right and left arms. A blood pressure reading that is higher than normal on one occasion does not mean that you need treatment. If  it is not clear whether you have high blood pressure, you may be asked to return on a different day to have your blood pressure checked again. Or, you may be asked to monitor your blood pressure at home for 1 or more weeks. TREATMENT Treating high blood pressure includes making lifestyle changes and possibly taking medicine. Living a healthy lifestyle can help lower high blood pressure. You may need to change some of your habits. Lifestyle changes may include:  Following the DASH diet. This diet is high in fruits, vegetables, and whole grains. It is low in salt, red meat, and added sugars.  Keep your sodium intake below 2,300 mg per day.  Getting at least 30-45 minutes of aerobic exercise at least 4 times per week.  Losing weight if necessary.  Not smoking.  Limiting alcoholic beverages.  Learning ways to reduce stress. Your health care provider may prescribe medicine if lifestyle changes are not enough to get your blood pressure under control, and if one of the following is true:  You are 18-59 years of age and your systolic blood pressure is above 140.  You are 60 years of age or older, and your systolic blood pressure is above 150.  Your diastolic blood pressure is above 90.  You have diabetes, and your systolic blood pressure is over 140 or your diastolic blood pressure is over 90.  You have kidney disease and your blood pressure is above 140/90.  You have heart disease and your blood pressure is above 140/90. Your personal target blood pressure may vary depending on your medical conditions, your age, and other factors. HOME CARE INSTRUCTIONS    Have your blood pressure rechecked as directed by your health care provider.   Take medicines only as directed by your health care provider. Follow the directions carefully. Blood pressure medicines must be taken as prescribed. The medicine does not work as well when you skip doses. Skipping doses also puts you at risk for  problems.  Do not smoke.   Monitor your blood pressure at home as directed by your health care provider. SEEK MEDICAL CARE IF:   You think you are having a reaction to medicines taken.  You have recurrent headaches or feel dizzy.  You have swelling in your ankles.  You have trouble with your vision. SEEK IMMEDIATE MEDICAL CARE IF:  You develop a severe headache or confusion.  You have unusual weakness, numbness, or feel faint.  You have severe chest or abdominal pain.  You vomit repeatedly.  You have trouble breathing. MAKE SURE YOU:   Understand these instructions.  Will watch your condition.  Will get help right away if you are not doing well or get worse.   This information is not intended to replace advice given to you by your health care provider. Make sure you discuss any questions you have with your health care provider.   Document Released: 11/22/2005 Document Revised: 04/08/2015 Document Reviewed: 09/14/2013 Elsevier Interactive Patient Education 2016 Elsevier Inc.  

## 2015-09-17 ENCOUNTER — Encounter: Payer: Self-pay | Admitting: Family Medicine

## 2015-09-17 ENCOUNTER — Telehealth: Payer: Self-pay | Admitting: Family Medicine

## 2015-09-17 NOTE — Telephone Encounter (Signed)
Barry Owens [name], daughter, to let her know letter for sister Stephanie AcreBetty Lenhoff is ready for p/u. See letters

## 2015-09-18 ENCOUNTER — Other Ambulatory Visit: Payer: Self-pay

## 2015-09-18 NOTE — Patient Outreach (Signed)
[facility]) Care Management  09/18/2015  Barry Owens [facility] 1930-12-19 [id number]   [name] WEEK 4  Contact call to patient's primary caregiver/daughter/[name].   Raquel Sarna confirms Dr. Linna Darner has prepared needed letter for sister to travel to [place] to visit her father.  States patient has improved greatly since UTI has been treated and confusion has cleared.  States HHS continue and SN completed visit today.  States BP is down to 150-156/80's since UTI has cleared and after taking BP medication everyday.   RN CM advised in 4 week Emmi Goals of Care met RN CM encouraged to report any Change in Condition to MD and / or any signs/symptoms of STROKE, HTN.  RN CM encouraged to keep all MD appt's.  RN CM sent case closure letter to Dr. Erlinda Hong.  RN CM notified [facility]: Emmi Program completed.   Mariann Laster, RN, BSN, [facility], CCM  [employer] Management Coordinator [phone] Direct [phone] Cell [phone] Office [phone] Fax

## 2015-09-18 NOTE — Telephone Encounter (Signed)
She is not on his HIPAA, I can speak to him if he has questions.She states she no longer needs help the lab has already discussed labs with her. She has asked me to disregard. She states she is able to view everything in his my chart

## 2015-09-22 NOTE — Patient Outreach (Signed)
[facility]) Care Management  09/22/2015  Iain Sawchuk [facility] November 04, 1931 [id number]   Notification from Mariann Laster, RN to close case as goals met with EMMI Stroke Transition.  Thanks, Ronnell Freshwater. [place], [place] Assistant Phone: [phone] Fax: [phone]

## 2015-09-23 ENCOUNTER — Encounter: Payer: Self-pay | Admitting: Neurology

## 2015-09-23 ENCOUNTER — Ambulatory Visit (INDEPENDENT_AMBULATORY_CARE_PROVIDER_SITE_OTHER): Payer: Self-pay | Admitting: Neurology

## 2015-09-23 ENCOUNTER — Other Ambulatory Visit: Payer: Self-pay

## 2015-09-23 VITALS — BP 170/74 | HR 63 | Ht 72.0 in | Wt 226.0 lb

## 2015-09-23 DIAGNOSIS — E785 Hyperlipidemia, unspecified: Secondary | ICD-10-CM

## 2015-09-23 DIAGNOSIS — I10 Essential (primary) hypertension: Secondary | ICD-10-CM

## 2015-09-23 DIAGNOSIS — I611 Nontraumatic intracerebral hemorrhage in hemisphere, cortical: Secondary | ICD-10-CM

## 2015-09-23 MED ORDER — AMLODIPINE BESYLATE 10 MG PO TABS: 10.0000 mg | ORAL_TABLET | Freq: Every day | ORAL | Status: DC

## 2015-09-23 NOTE — Patient Instructions (Signed)
-   will do head CT to evaluate the resolution of brian bleed - will add amlodipine 10mg  daily for high BP - continue lisinopril 20mg  twice a day and lasix - Follow up with your primary care physician for stroke risk factor modification. Recommend maintain blood pressure goal <140/90, diabetes with hemoglobin A1c goal below 6.5% and lipids with LDL cholesterol goal below 70 mg/dL.  - follow up with eye doctor for visual field testing - check BP at home - continue pravastatin for high LDL - will add ASA 81mg  later once blood absorbed and BP in good control for stroke prevention - follow up in 2 months.

## 2015-09-23 NOTE — Progress Notes (Signed)
STROKE NEUROLOGY FOLLOW UP NOTE  NAME: Barry Owens DOB: 21-Jan-1931  REASON FOR VISIT: stroke follow up HISTORY FROM: daughter and granddaughter and chart  Today we had the pleasure of seeing [name] in follow-up at our Neurology Clinic. Pt was accompanied by wife, daughter and granddaughter.   History Summary [name] is a 81 y.o. male with history of poorly controlled hypertension and possible dementia admitted on 08/16/15 for left occipital hemorrhage on CT. MRI confirmed left occipital ICH but no evidence of CAA. MRA showed no AVM. CUS, 2D echo unremarkable. LDL 109 and A1C 6.0. Pt has right hemianopia and psychomotor slowing. Put on pravastatin and discharged to home  Interval History During the interval time, the patient has been stable. Still has right hemianopia and just followed with ophthal yesterday. He also followed with PCP on 09/15/15, still has high BP on lisinopril  bid. No new BP meds given. Today in clinic 170/74. Seems to have cognitive impairment but daughter said English is not his native language and the cognitive impairment is partly due to language.      REVIEW OF SYSTEMS: Full 14 system review of systems performed and notable only for those listed below and in HPI above, all others are negative:  Constitutional:  fever Cardiovascular:  Ear/Nose/Throat:   Skin:  Eyes:   Respiratory:   Gastroitestinal:   Genitourinary: painful urination, blood in urine Hematology/Lymphatic:   Endocrine:  Musculoskeletal:   Allergy/Immunology:   Neurological:   HA Psychiatric:  Sleep:   The following represents the patient's updated allergies and side effects list: No Known Allergies  The neurologically relevant items on the patient's problem list were reviewed on today's visit.  Neurologic Examination  A problem focused neurological exam (12 or more points of the single system neurologic examination, vital signs counts as 1 point,  cranial nerves count for 8 points) was performed.  Blood pressure 170/74, pulse 63, height 6' (1.829 m), weight 226 lb (102.513 kg).  General - Well nourished, well developed, in no apparent distress.  Ophthalmologic - Fundi not visualized due to noncooperation.  Cardiovascular - Regular rate and rhythm.  Mental Status -  Level of arousal and orientation to place, and person were intact, but not orientated to time, president. Language including expression, naming, repetition, comprehension was assessed and found intact, but significant psychomotor slowing, partly due to language barrier. Fund of Knowledge was assessed and was impaired.   Cranial Nerves II - XII - II - right dense hemianopia. III, IV, VI - Extraocular movements intact. V - Facial sensation intact bilaterally. VII - Facial movement intact bilaterally. VIII - Hearing & vestibular intact bilaterally. X - Palate elevates symmetrically. XI - Chin turning & shoulder shrug intact bilaterally. XII - Tongue protrusion intact.  Motor Strength - The patient's strength was 4/5 in all extremities and pronator drift was absent.  Bulk was normal and fasciculations were absent.   Motor Tone - Muscle tone was assessed at the neck and appendages and was normal.  Reflexes - The patient's reflexes were 1+ in all extremities and he had no pathological reflexes.  Sensory - Light touch, temperature/pinprick were assessed and were normal.    Coordination - The patient had normal movements in the hands with no ataxia or dysmetria.  Tremor was absent.  Gait and Station - The patient's transfers, posture, gait, station, and turns were observed as normal.  Data reviewed: I personally reviewed the images and agree with the radiology  interpretations.  Ct Head Wo Contrast  08/16/2015 IMPRESSION: Acute LEFT occipital lobe intracerebral hemorrhage measuring 31 x 25 mm cross-section. Mild surrounding edema but no shift. Considerations include  atypical lobar hypertensive bleed, cerebral amyloid angiopathy related hemorrhage, complication of illicit drug use or PRES, unrecognized trauma or coagulopathy, or underlying vascular malformation or vascular tumor. Disproportionate ventricular enlargement without cortical atrophy, query normal-pressure hydrocephalus.   Mri and Mra Head Wo Contrast  08/17/2015 IMPRESSION: 1. 3.4 x 2.0 x 2.9 cm hemorrhage with local mass effect in the left parietal lobe. 2. High-grade stenosis of the proximal left posterior cerebral artery. This may represent hemorrhagic conversion of a focal infarct. 3. Disproportionate prominence of the ventricular system. Normal pressure hydrocephalus is still considered. 4. No evidence for vasculitis or previous hemorrhage. 5. Periventricular T2 changes bilaterally likely reflecting small vessel ischemic change. This could in part be related to hydrocephalus.   Carotid Doppler Carotid Duplex Completed. Mild intimal thickening with no evidence of stenosis noted in both carotid arteries. Bilateral vertebral arteries demonstrate antegrade flow.   2D Echocardiogram - Left ventricle: The cavity size was normal. Wall thickness was normal. Systolic function was normal. The estimated ejection fraction was in the range of 55% to 60%. Wall motion was normal; there were no regional wall motion abnormalities. Doppler parameters are consistent with abnormal left ventricular relaxation (grade 1 diastolic dysfunction). The E/e&' ratio is between 8-15, suggesting indeterminate LV filling pressure. - Left atrium: The atrium was normal in size. - Atrial septum: No defect or patent foramen ovale was identified. Impressions - LVEF 55-60%, normal wall motion, diastolic dysfunction, indeterminate LV filling pressure, normal LA size, no PFO by color doppler.  Component     Latest Ref Rng 08/16/2015 08/18/2015  Cholesterol     0 - 200 mg/dL  119  Triglycerides     <150  mg/dL  56  HDL Cholesterol     >40 mg/dL  44  Total CHOL/HDL Ratio       3.7  VLDL     0 - 40 mg/dL  11  LDL (calc)     0 - 99 mg/dL  147 (H)  Hemoglobin W2N     4.8 - 5.6 %  6.0 (H)  Mean Plasma Glucose       126  Vitamin B-12     180 - 914 pg/mL 371   TSH     0.350 - 4.500 uIU/mL 0.722   Folate     >5.9 ng/mL  14.6    Assessment: As you may recall, he is a 81 y.o. African American male with PMH of poorly controlled hypertension and possible dementia admitted on 08/16/15 for left occipital hemorrhage on CT. MRI confirmed left occipital ICH but no evidence of CAA. MRA showed no AVM. CUS, 2D echo unremarkable. LDL 109 and A1C 6.0. Pt has right hemianopia and psychomotor slowing. Put on pravastatin and discharged to home. During the interval time, he still has high BP with lisinopril  bid. Will add amlodipine and repeat CT.   Plan:  - repeat CT  - add amlodipine  daily for high BP - continue lisinopril  twice a day and lasix - Follow up with your primary care physician for stroke risk factor modification. Recommend maintain blood pressure goal <140/90, diabetes with hemoglobin A1c goal below 6.5% and lipids with LDL cholesterol goal below 70 mg/dL.  - follow up with ophthalmology for visual field testing - check BP at home - continue pravastatin for high LDL -  will consider baby ASA later once CT clear for bleeding and BP in good control - follow up in 2 months.  I spent more than 25 minutes of face to face time with the patient. Greater than 50% of time was spent in counseling and coordination of care.   Orders Placed This Encounter  Procedures  . CT Head Wo Contrast    Standing Status: Future     Number of Occurrences:      Standing Expiration Date: 12/23/2016    Order Specific Question:  Reason for Exam (SYMPTOM  OR DIAGNOSIS REQUIRED)    Answer:  ICH follow up    Order Specific Question:  Preferred imaging location?    Answer:  Internal    Meds ordered  this encounter  Medications  . amLODipine (NORVASC) 10 MG tablet    Sig: Take 1 tablet (10 mg total) by mouth daily.    Dispense:  30 tablet    Refill:  5    Patient Instructions  - will do head CT to evaluate the resolution of brian bleed - will add amlodipine 10mg  daily for high BP - continue lisinopril 20mg  twice a day and lasix - Follow up with your primary care physician for stroke risk factor modification. Recommend maintain blood pressure goal <140/90, diabetes with hemoglobin A1c goal below 6.5% and lipids with LDL cholesterol goal below 70 mg/dL.  - follow up with eye doctor for visual field testing - check BP at home - continue pravastatin for high LDL - will add ASA 81mg  later once blood absorbed and BP in good control for stroke prevention - follow up in 2 months.   Marvel PlanJindong Kylina Vultaggio, MD PhD [facility] [address], [address] [place], [place] [address] [phone]

## 2015-09-24 ENCOUNTER — Telehealth: Payer: Self-pay | Admitting: Family Medicine

## 2015-09-24 NOTE — Telephone Encounter (Signed)
Left message for patient to call to schedule an appointment to have blood pressure checked.

## 2015-10-02 ENCOUNTER — Ambulatory Visit
Admission: RE | Admit: 2015-10-02 | Discharge: 2015-10-02 | Disposition: A | Discharge status: Home / Self Care | Payer: No Typology Code available for payment source | Source: Ambulatory Visit | Attending: Neurology | Admitting: Neurology

## 2015-10-02 DIAGNOSIS — I611 Nontraumatic intracerebral hemorrhage in hemisphere, cortical: Secondary | ICD-10-CM

## 2015-10-07 ENCOUNTER — Telehealth: Payer: Self-pay | Admitting: Neurology

## 2015-10-07 DIAGNOSIS — Z8673 Personal history of transient ischemic attack (TIA), and cerebral infarction without residual deficits: Secondary | ICD-10-CM

## 2015-10-07 MED ORDER — ASPIRIN EC 81 MG PO TBEC: 81.0000 mg | DELAYED_RELEASE_TABLET | Freq: Every day | ORAL | Status: DC

## 2015-10-07 NOTE — Telephone Encounter (Signed)
Rn call patient Barry Owens with [facility] nurse for patient. The pts nurse number is [phone] his wife wanted to know the results. The nurse is a part of the treatment plan for patient. The pt had a CT scan from his chart. Barry Owens stated the family wanted to know the results. Rn stated Dr. Roda ShuttersXu will call the family and wife.

## 2015-10-07 NOTE — Telephone Encounter (Signed)
[facility] [phone] currently at patient's home. Patient was inquiring as to the results of imaging study done last Wednesday 10/01/15.

## 2015-10-07 NOTE — Telephone Encounter (Signed)
Called home phone number and did not get answer.  Called the nurse [facility]te [facility] and got pt wife phone number [phone].  Discussed with wife over the phone and relayed head CT result to her. Pt ICH was absorbed and wife said his BP is under control. Today BP 126/72. Will start ASA 81mg . Wife is going to buy from [facility].   Marvel PlanJindong Ernesto Zukowski, MD PhD Stroke Neurology 10/07/2015 5:01 PM  Meds ordered this encounter  Medications  . aspirin EC 81 MG tablet    Sig: Take 1 tablet (81 mg total) by mouth daily.

## 2015-10-07 NOTE — Telephone Encounter (Signed)
[facility] number is not [phone]. His number is [phone]

## 2015-10-10 ENCOUNTER — Ambulatory Visit: Payer: Self-pay | Admitting: Neurology

## 2015-10-17 ENCOUNTER — Ambulatory Visit: Payer: Self-pay | Admitting: Family Medicine

## 2015-10-20 ENCOUNTER — Other Ambulatory Visit: Payer: Self-pay | Admitting: Family Medicine

## 2015-10-20 DIAGNOSIS — F0391 Unspecified dementia with behavioral disturbance: Secondary | ICD-10-CM

## 2015-10-20 MED ORDER — DONEPEZIL HCL 5 MG PO TABS: 5.0000 mg | ORAL_TABLET | Freq: Every day | ORAL | Status: DC

## 2015-10-20 NOTE — Telephone Encounter (Signed)
Refill for Aricept sent into pharmacy. Thanks!

## 2015-10-21 ENCOUNTER — Ambulatory Visit: Payer: Self-pay | Admitting: Neurology

## 2015-10-31 ENCOUNTER — Other Ambulatory Visit: Payer: Self-pay | Admitting: Neurology

## 2015-11-03 ENCOUNTER — Other Ambulatory Visit: Payer: Self-pay

## 2015-11-03 DIAGNOSIS — Z8673 Personal history of transient ischemic attack (TIA), and cerebral infarction without residual deficits: Secondary | ICD-10-CM

## 2015-11-03 MED ORDER — ASPIRIN EC 81 MG PO TBEC: 81.0000 mg | DELAYED_RELEASE_TABLET | Freq: Every day | ORAL | Status: DC

## 2015-11-05 ENCOUNTER — Telehealth: Payer: Self-pay | Admitting: Neurology

## 2015-11-05 NOTE — Telephone Encounter (Signed)
It appears Massie MaroonLachina M Hollis, FNP prescribed Aricept earlier this month with 3 refills.  It appears Dr Roda ShuttersXu advise it would be kay to start Aspirin 81mg  tablets per note from 11/01.  These are available OTC.  Per RN, Dr Roda ShuttersXu prefers other medications be filled by PCP, so they can monitor BP, Stroke risk factor and cholesterol.  I called the patient back.  Got no answer, left message relaying this info, and asked that they call us back if anything further is needed.

## 2015-11-05 NOTE — Telephone Encounter (Signed)
Daughter Irving Burtonmily called to request refill of furosemide (LASIX) 20 MG tablet only has a couple of pills left and lisinopril (PRINIVIL,ZESTRIL) 20 MG tablet, pravastatin (PRAVACHOL) 20 MG tablet and donepezil (ARICEPT) 5 MG tablet only have 1 refill left[facility] [address] Dr. Roda ShuttersXu prescribed aspirin, not sure how much to take.

## 2015-11-07 ENCOUNTER — Other Ambulatory Visit: Payer: Self-pay

## 2015-11-07 ENCOUNTER — Telehealth (HOSPITAL_COMMUNITY): Payer: Self-pay | Admitting: *Deleted

## 2015-11-07 DIAGNOSIS — R600 Localized edema: Secondary | ICD-10-CM

## 2015-11-07 MED ORDER — PRAVASTATIN SODIUM 20 MG PO TABS: 20.0000 mg | ORAL_TABLET | Freq: Every day | ORAL | Status: DC

## 2015-11-07 MED ORDER — FUROSEMIDE 20 MG PO TABS: 20.0000 mg | ORAL_TABLET | Freq: Every day | ORAL | Status: DC

## 2015-11-07 MED ORDER — LISINOPRIL 20 MG PO TABS: 20.0000 mg | ORAL_TABLET | Freq: Two times a day (BID) | ORAL | Status: DC

## 2015-11-07 NOTE — Telephone Encounter (Signed)
Pt needs refill on Rx prescribed by former MD, also has questions.

## 2015-11-07 NOTE — Telephone Encounter (Signed)
Refills sent into pharmacy. Thanks!  

## 2015-11-13 NOTE — Telephone Encounter (Signed)
PEr med list pts medications were filled by a previous Provider his PCP. Meds were filled in the last two weeks. Note will be close.

## 2015-11-25 ENCOUNTER — Ambulatory Visit (INDEPENDENT_AMBULATORY_CARE_PROVIDER_SITE_OTHER): Payer: Medicaid Other | Admitting: Family Medicine

## 2015-11-25 VITALS — BP 140/58 | HR 64 | Temp 97.7°F | Resp 16 | Ht 71.0 in | Wt 226.0 lb

## 2015-11-25 DIAGNOSIS — Z8673 Personal history of transient ischemic attack (TIA), and cerebral infarction without residual deficits: Secondary | ICD-10-CM | POA: Diagnosis not present

## 2015-11-25 DIAGNOSIS — I10 Essential (primary) hypertension: Secondary | ICD-10-CM | POA: Diagnosis not present

## 2015-11-25 DIAGNOSIS — F0390 Unspecified dementia without behavioral disturbance: Secondary | ICD-10-CM | POA: Diagnosis not present

## 2015-11-25 LAB — COMPLETE METABOLIC PANEL WITH GFR
ALBUMIN: 4.1 g/dL (ref 3.6–5.1)
ALK PHOS: 70 U/L (ref 40–115)
ALT: 13 U/L (ref 9–46)
AST: 27 U/L (ref 10–35)
BUN: 7 mg/dL (ref 7–25)
CALCIUM: 8.7 mg/dL (ref 8.6–10.3)
CO2: 21 mmol/L (ref 20–31)
CREATININE: 0.92 mg/dL (ref 0.70–1.11)
Chloride: 105 mmol/L (ref 98–110)
GFR, Est Non African American: 78 mL/min (ref >=60)
Glucose, Bld: 148 mg/dL — ABNORMAL HIGH (ref 65–99)
Potassium: 4.5 mmol/L (ref 3.5–5.3)
Sodium: 139 mmol/L (ref 135–146)
Total Bilirubin: 0.7 mg/dL (ref 0.2–1.2)
Total Protein: 7.1 g/dL (ref 6.1–8.1)

## 2015-11-25 LAB — POCT URINALYSIS DIP (DEVICE)
Bilirubin Urine: NEGATIVE
Glucose, UA: NEGATIVE mg/dL
Ketones, ur: NEGATIVE mg/dL
NITRITE: NEGATIVE
PROTEIN: NEGATIVE mg/dL
Specific Gravity, Urine: 1.02 (ref 1.005–1.030)
UROBILINOGEN UA: 0.2 mg/dL (ref 0.0–1.0)
pH: 5 (ref 5.0–8.0)

## 2015-11-25 NOTE — Progress Notes (Signed)
Subjective:    Patient ID: Barry Owens, male    DOB: Oct 01, 1931, 81 y.o.   MRN: [id number]  Hypertension Pertinent negatives include no shortness of breath.   [name], an 81 year old male presents for follow up of hypertension accompanied by wife and granddaughter. Blood pressures have improved on medication regimen. Patient has consistently been taking medications. Patient is having blood pressure checked several times pe week by outpatient RNs. Also, his family prepares his meals and ensures that he eats a low sodium, low fat diet. He denies headache, dizziness, shortness of breath, chest pains, or dyspnea. Patient is alert, oriented, and smiling.  Past Medical History  Diagnosis Date  . Stroke (HCC)   . Hypertension   . Cataract    Social History   Social History  . Marital Status: Married    Spouse Name: N/A  . Number of Children: N/A  . Years of Education: N/A   Occupational History  . Not on file.   Social History Main Topics  . Smoking status: Former Smoker    Types: Cigarettes    Quit date: 04/30/1987  . Smokeless tobacco: Not on file  . Alcohol Use: No  . Drug Use: No  . Sexual Activity: Not on file   Other Topics Concern  . Not on file   Social History Narrative     Medication List       This list is accurate as of: 11/25/15  2:02 PM.  Always use your most recent med list.               acetaminophen 650 MG CR tablet  Commonly known as:  TYLENOL  Take 1,300 mg by mouth every 8 (eight) hours as needed for pain.     amLODipine 10 MG tablet  Commonly known as:  NORVASC  Take 1 tablet (10 mg total) by mouth daily.     aspirin EC 81 MG tablet  Take 1 tablet (81 mg total) by mouth daily. (Patient may obtain OTC)     clotrimazole-betamethasone cream  Commonly known as:  LOTRISONE  Apply 1 application topically 2 (two) times daily.     donepezil 5 MG tablet  Commonly known as:  ARICEPT  Take 1 tablet (5 mg total) by mouth at  bedtime.     furosemide 20 MG tablet  Commonly known as:  LASIX  Take 1 tablet (20 mg total) by mouth daily.     lisinopril 20 MG tablet  Commonly known as:  PRINIVIL,ZESTRIL  Take 1 tablet (20 mg total) by mouth 2 (two) times daily.     multivitamin tablet  Take 1 tablet by mouth daily.     pravastatin 20 MG tablet  Commonly known as:  PRAVACHOL  Take 1 tablet (20 mg total) by mouth daily at 6 PM.       Review of Systems  Constitutional: Negative for fever.  Eyes: Positive for redness. Negative for photophobia.  Respiratory: Negative.  Negative for shortness of breath.   Cardiovascular: Negative.   Gastrointestinal: Negative.   Endocrine: Negative for polydipsia, polyphagia and polyuria.  Genitourinary: Negative.  Negative for urgency, flank pain and difficulty urinating.  Musculoskeletal: Positive for back pain (occasional back pain).  Skin:       Itching to right foot  Allergic/Immunologic: Negative.   Neurological: Negative for tremors, weakness and numbness.  Hematological: Negative.   Psychiatric/Behavioral: Positive for confusion (Confusion has improved on Aricept). Negative for hallucinations and sleep  disturbance. The patient is not hyperactive.        Granddaughter states that he occasionally becomes fixated on specific tasks or past events. He is also under the care of neurology.        Objective:   Physical Exam  Constitutional: He is oriented to person, place, and time. He appears well-developed and well-nourished.  HENT:  Head: Normocephalic and atraumatic.  Right Ear: External ear normal.  Left Ear: External ear normal.  Mouth/Throat: Oropharynx is clear and moist.  Eyes: Conjunctivae and EOM are normal. Pupils are equal, round, and reactive to light.  Neck: Normal range of motion. Neck supple.  Cardiovascular: Normal rate, regular rhythm, normal heart sounds and intact distal pulses.   Pulmonary/Chest: Effort normal and breath sounds normal.   Abdominal: Soft. Bowel sounds are normal.  Musculoskeletal: Normal range of motion.  Neurological: He is alert and oriented to person, place, and time. He has normal reflexes.  Skin: Skin is warm and dry.  Psychiatric: He has a normal mood and affect. His behavior is normal. Judgment and thought content normal.       BP 140/58 mmHg  Pulse 64  Temp(Src) 97.7 F (36.5 C) (Oral)  Resp 16  Ht 5\' 11"  (1.803 m)  Wt 226 lb (102.513 kg)  BMI 31.53 kg/m2 Assessment & Plan:   1. Essential hypertension Blood pressure is at goal on current regimen. Will continue medications at current dosages. Encouraged his family to continue low sodium diet and encourage him to drink more water.  - COMPLETE METABOLIC PANEL WITH GFR - POCT urinalysis dipstick  2. Dementia, without behavioral disturbance Family reports that cognition has improved with Aricept. State that behaviors have improved. Patient did not have to be redirected during appointment. He answered all questions appropriately and responds to commands with minimal assistance.   3. History of stroke Patient is to follow up with Dr. Roda ShuttersXu as previously scheduled. Reviewed previous notes.   The patient was given clear instructions to go to ER or return to medical center if symptoms do not improve, worsen or new problems develop. The patient verbalized understanding.   RTC: 3 months for hypertension.  Massie MaroonHollis,Andrian Sabala M, FNP

## 2015-11-26 ENCOUNTER — Encounter: Payer: Self-pay | Admitting: Family Medicine

## 2015-11-26 ENCOUNTER — Telehealth: Payer: Self-pay

## 2015-11-26 NOTE — Telephone Encounter (Signed)
-----   Message from Massie MaroonLachina M Hollis, [place]NP sent at 11/26/2015  8:37 AM EST ----- Please inform patients family that all labs are within normal limits. Continue medication regimen as previously prescribed. Will follow up in 3 months.   Thanks  ----- Message -----    From: [facility]    Sent: 11/25/2015   9:16 PM      To: Massie MaroonLachina M Hollis, FNP

## 2015-11-26 NOTE — Telephone Encounter (Signed)
Called and spoke to Daughter and advised of lab results.

## 2015-11-30 IMAGING — CT CT HEAD W/O CM
2 series · 15 of 30 positions shown, 19 images · non-contrast
Comparison: None.

CLINICAL DATA: Blurred vision with unsteady gait. Symptoms began,
and have been worsening, for 5 days.

EXAM:
CT HEAD WITHOUT CONTRAST
TECHNIQUE: Contiguous axial images were obtained from the base of the skull
through the vertex without intravenous contrast.

[Series 201: head w/o, idose (1) · axial · non-contrast · 0.49mm/px · z∈[+100,+230]mm · 13 of 32 slices shown, 17 images]
[im 3/32  brain]
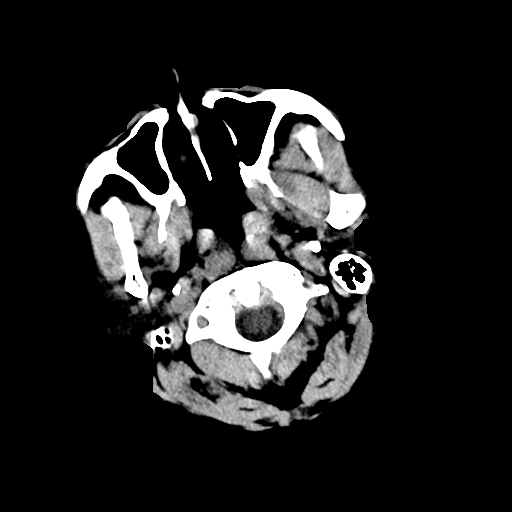
[im 3/32  bone]
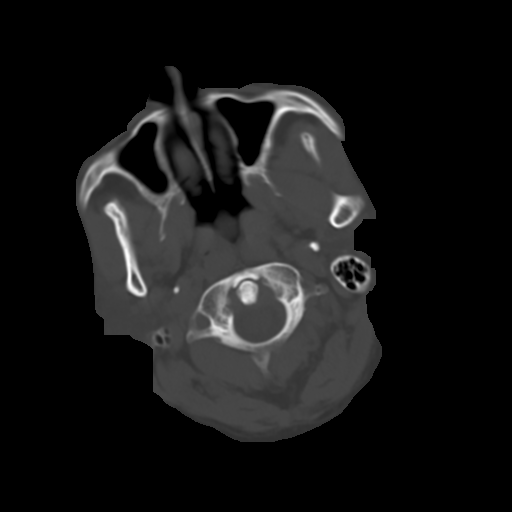
[im 5/32  brain]
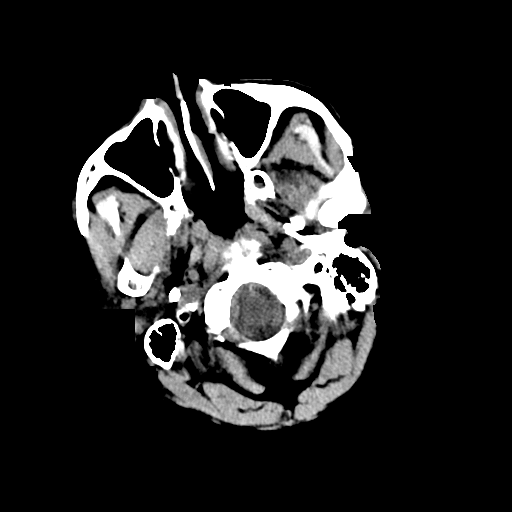
[im 7/32  brain]
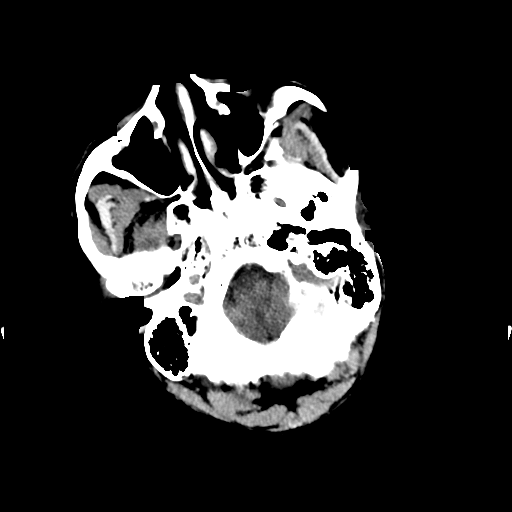
[im 9/32  brain]
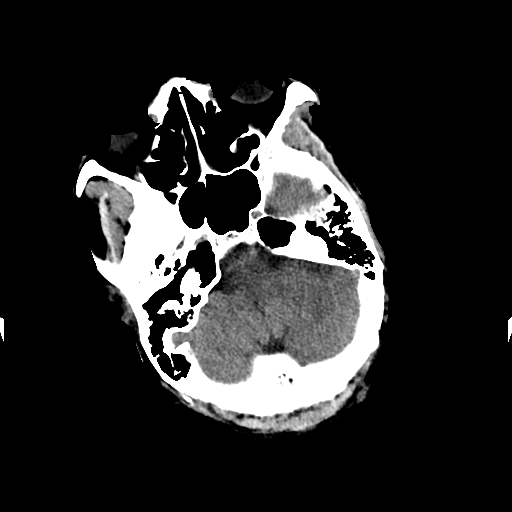
[im 12/32  brain]
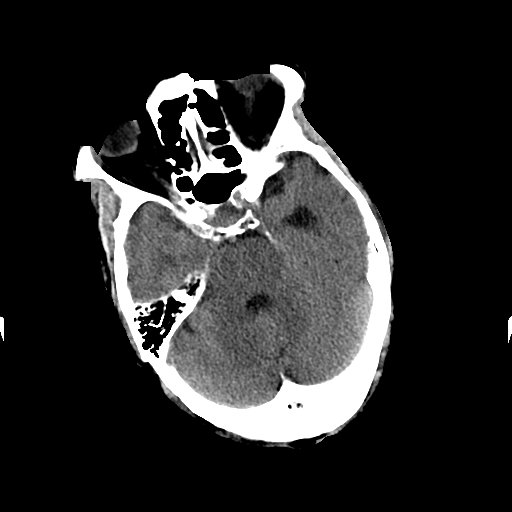
[im 12/32  bone]
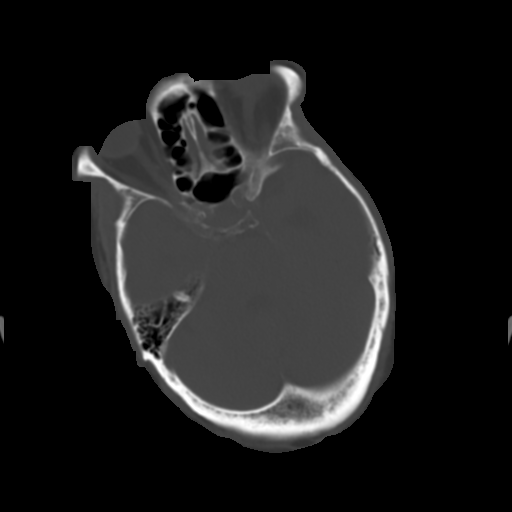
[im 14/32  brain]
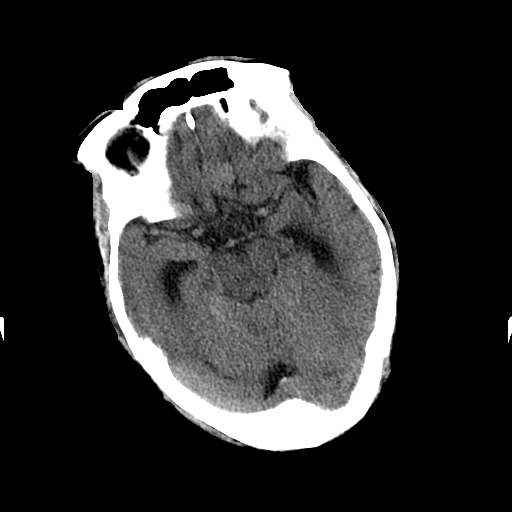
[im 16/32  brain]
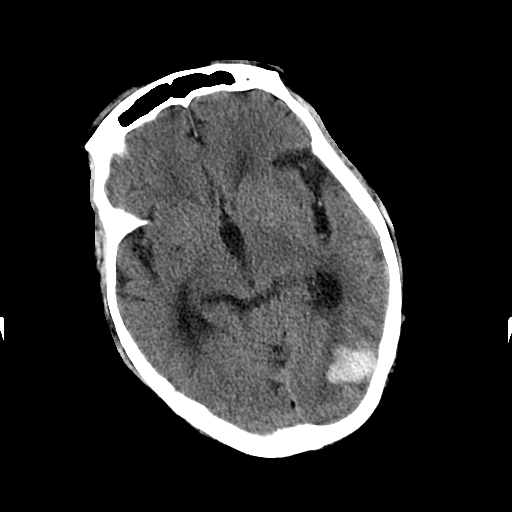
[im 18/32  brain]
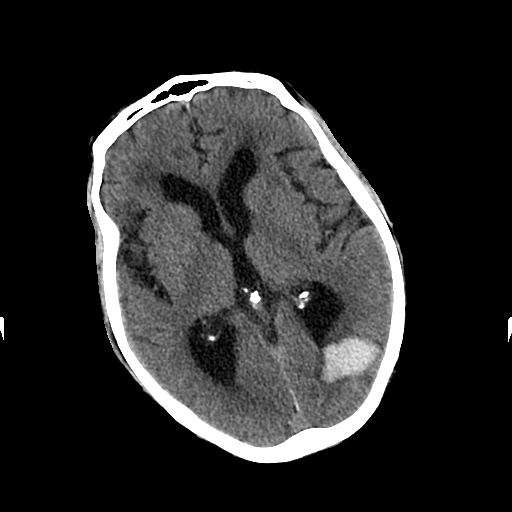
[im 20/32  brain]
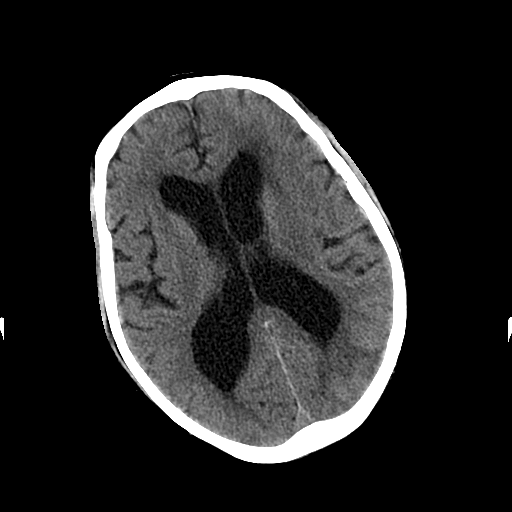
[im 20/32  bone]
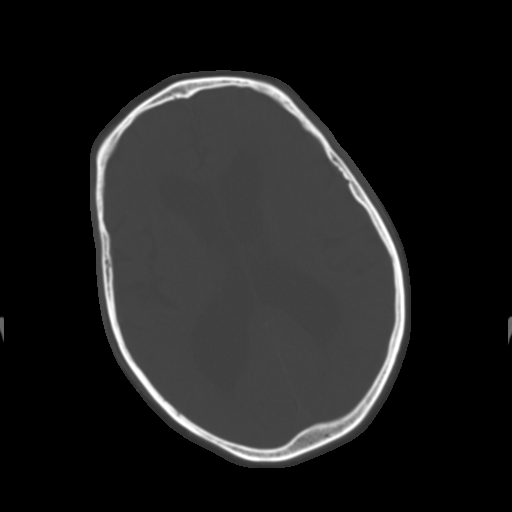
[im 23/32  brain]
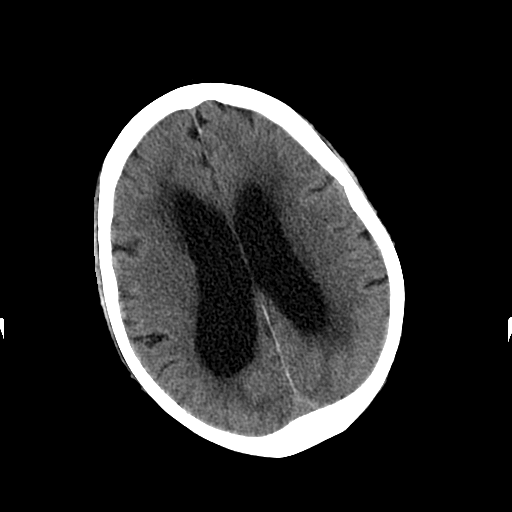
[im 25/32  brain]
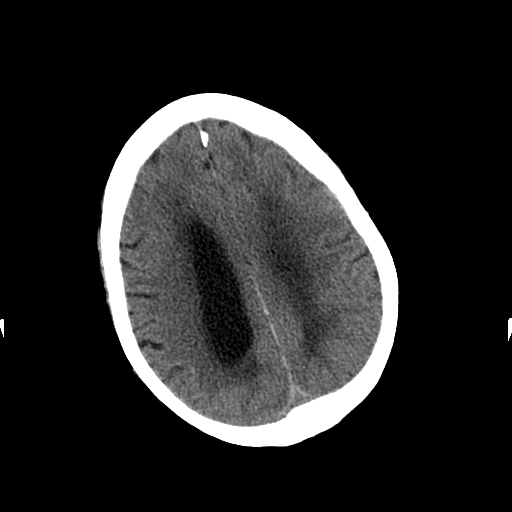
[im 27/32  brain]
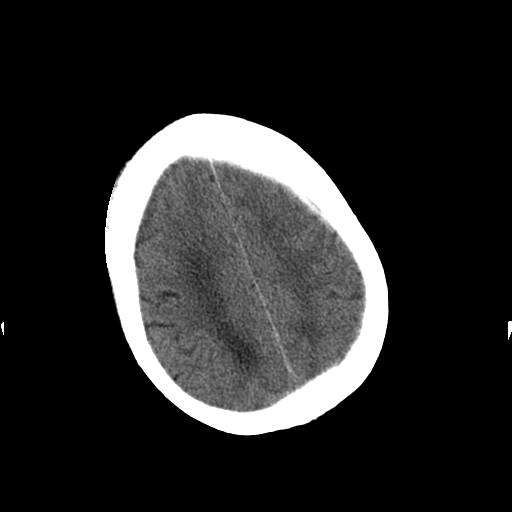
[im 29/32  brain]
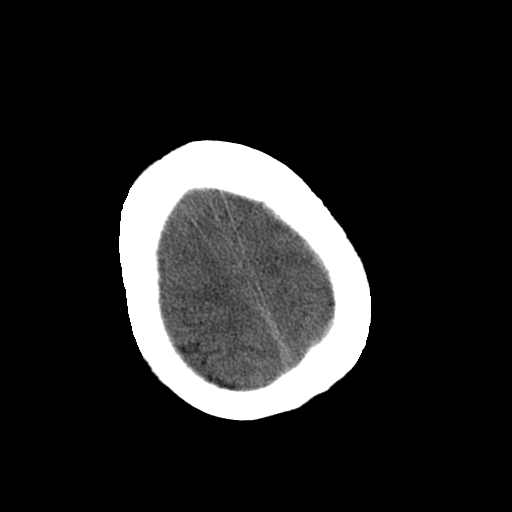
[im 29/32  bone]
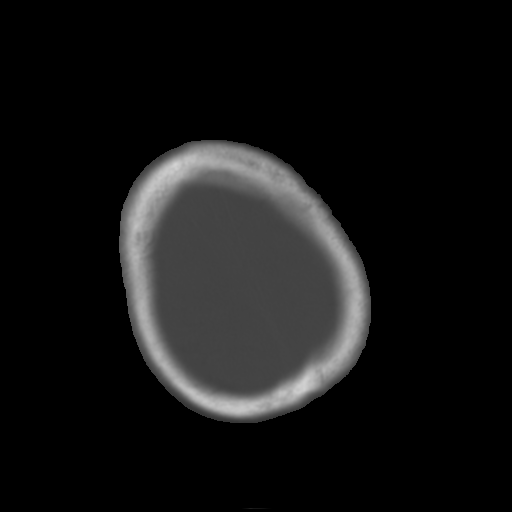

[Series 202: head w/o bone, idose (1) · axial · non-contrast · 0.49mm/px · z∈[+100,+120]mm · 2 of 32 slices shown]
[im 3/32  bone]
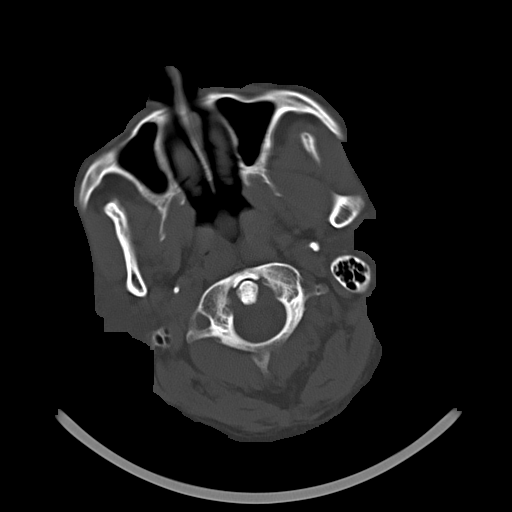
[im 7/32  bone]
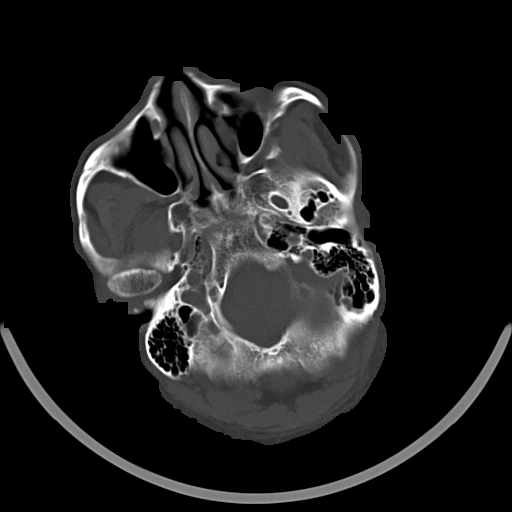

[15 of 30 positions shown; findings below may reference images not displayed]

FINDINGS: There is an acute intracerebral hemorrhage in the LEFT occipital
lobe measuring 31 x 25 mm cross-section with mild surrounding edema.
No shift. No intraventricular extension. No subdural or subarachnoid
extension. No other similar foci of hemorrhage. No underlying skull
fracture.

No features suggestive of acute stroke. No extra-axial hematoma.
Disproportionate enlargement of the lateral and third ventricles
without corresponding cortical atrophy, suggesting normal pressure
hydrocephalus.

No sinus or mastoid disease.
IMPRESSION: Acute LEFT occipital lobe intracerebral hemorrhage measuring 31 x 25
mm cross-section. Mild surrounding edema but no shift.
Considerations include atypical lobar hypertensive bleed, cerebral
amyloid angiopathy related hemorrhage, complication of illicit drug
use or PRES, unrecognized trauma or coagulopathy, or underlying
vascular malformation or vascular tumor.

Disproportionate ventricular enlargement without cortical atrophy,
query normal-pressure hydrocephalus.

Critical Value/emergent results were called by telephone at the time
of interpretation on 08/16/2015 at [DATE] to Dr. INGER KRISTINE PAULSBOE ,
who verbally acknowledged these results.

## 2015-12-18 ENCOUNTER — Ambulatory Visit (INDEPENDENT_AMBULATORY_CARE_PROVIDER_SITE_OTHER): Payer: Self-pay | Admitting: Neurology

## 2015-12-18 ENCOUNTER — Other Ambulatory Visit: Payer: Self-pay

## 2015-12-18 ENCOUNTER — Encounter: Payer: Self-pay | Admitting: Neurology

## 2015-12-18 DIAGNOSIS — I611 Nontraumatic intracerebral hemorrhage in hemisphere, cortical: Secondary | ICD-10-CM | POA: Insufficient documentation

## 2015-12-18 DIAGNOSIS — E785 Hyperlipidemia, unspecified: Secondary | ICD-10-CM

## 2015-12-18 DIAGNOSIS — I10 Essential (primary) hypertension: Secondary | ICD-10-CM

## 2015-12-18 DIAGNOSIS — F0390 Unspecified dementia without behavioral disturbance: Secondary | ICD-10-CM

## 2015-12-18 MED ORDER — DONEPEZIL HCL 10 MG PO TABS: 10.0000 mg | ORAL_TABLET | Freq: Every day | ORAL | Status: DC

## 2015-12-18 NOTE — Progress Notes (Signed)
STROKE NEUROLOGY FOLLOW UP NOTE  NAME: Barry Owens DOB: 18-Sep-1931  REASON FOR VISIT: stroke follow up HISTORY FROM: granddaughter and chart  Today we had the pleasure of seeing Barry Owens in follow-up at our Neurology Clinic. Pt was accompanied by wife, and granddaughter.   History Summary [name] is a 82 y.o. male with history of poorly controlled hypertension and possible dementia admitted on 08/16/15 for left occipital hemorrhage on CT. MRI confirmed left occipital ICH but no evidence of CAA. MRA showed no AVM. CUS, 2D echo unremarkable. LDL 109 and A1C 6.0. Pt has right hemianopia and psychomotor slowing. Put on pravastatin and discharged to home.  09/23/15 follow up - the patient has been stable. Still has right hemianopia and just followed with ophthal yesterday. He also followed with PCP on 09/15/15, still has high BP on lisinopril 20mg  bid. No new BP meds given. Today in clinic 170/74. Seems to have cognitive impairment but daughter said English is not his native language and the cognitive impairment is partly due to language.   Interval History During the interval time, pt has been stable. Right hemianopia seems improved. He followed with ophthalmology once for visual field testing and prescribed with glasses. Has not had second visual field testing yet. His BP better controlled. Today 140/58. He has been put on ASA 81mg  after CT showed ICH resolved. He was also put on aricept recently by PCP. Still at 5mg  dose more than one month now.  REVIEW OF SYSTEMS: Full 14 system review of systems performed and notable only for those listed below and in HPI above, all others are negative:  Constitutional:  fever Cardiovascular:  Ear/Nose/Throat:   Skin:  Eyes:   Respiratory:   Gastroitestinal:   Genitourinary:  Hematology/Lymphatic:   Endocrine:  Musculoskeletal:   Allergy/Immunology:   Neurological:    Psychiatric:  Sleep:   The following  represents the patient's updated allergies and side effects list: No Known Allergies  The neurologically relevant items on the patient's problem list were reviewed on today's visit.  Neurologic Examination  A problem focused neurological exam (12 or more points of the single system neurologic examination, vital signs counts as 1 point, cranial nerves count for 8 points) was performed.  Blood pressure 145/58, pulse 67, height 6\' 1"  (1.854 m), weight 229 lb 3.2 oz (103.964 kg).  General - Well nourished, well developed, in no apparent distress.  Ophthalmologic - Fundi not visualized due to noncooperation.  Cardiovascular - Regular rate and rhythm.  Mental Status -  Level of arousal and orientation to place, president and person were intact, but not orientated to time. Language including expression, naming, repetition, comprehension was assessed and found intact, still has moderate psychomotor slowing, partly due to language barrier. Fund of Knowledge was assessed and was impaired.   Cranial Nerves II - XII - II - pt not cooperative on exam, but right hemianopia seems much improved. III, IV, VI - Extraocular movements intact. V - Facial sensation intact bilaterally. VII - Facial movement intact bilaterally. VIII - Hearing & vestibular intact bilaterally. X - Palate elevates symmetrically. XI - Chin turning & shoulder shrug intact bilaterally. XII - Tongue protrusion intact.  Motor Strength - The patient's strength was 4/5 in all extremities and pronator drift was absent.  Bulk was normal and fasciculations were absent.   Motor Tone - Muscle tone was assessed at the neck and appendages and was normal.  Reflexes - The patient's reflexes were 1+  in all extremities and he had no pathological reflexes.  Sensory - Light touch, temperature/pinprick were assessed and were normal.    Coordination - The patient had normal movements in the hands with no ataxia or dysmetria.  Tremor was  absent.  Gait and Station - slow raising up from chair, slow and small stride, cautious gait.   Data reviewed: I personally reviewed the images and agree with the radiology interpretations.  Ct Head Wo Contrast  08/16/2015 IMPRESSION: Acute LEFT occipital lobe intracerebral hemorrhage measuring 31 x 25 mm cross-section. Mild surrounding edema but no shift. Considerations include atypical lobar hypertensive bleed, cerebral amyloid angiopathy related hemorrhage, complication of illicit drug use or PRES, unrecognized trauma or coagulopathy, or underlying vascular malformation or vascular tumor. Disproportionate ventricular enlargement without cortical atrophy, query normal-pressure hydrocephalus.   10/02/15 - 1. Chronic left occipital lobe hemorrhage with interval decrease in size and expected evolutional change compared to CT on 08/16/15. The hemorrhage now is hypodense on CT. No evidence of growth or expansion. 2. Moderate-severe ventriculomegaly on ex vacuo basis from chronic small vessel ischemic disease and subcortical atrophy vs communicating hydrocephalus. This is stable from CT on 08/16/15.  3. No acute findings.  Mri and Mra Head Wo Contrast  08/17/2015 IMPRESSION: 1. 3.4 x 2.0 x 2.9 cm hemorrhage with local mass effect in the left parietal lobe. 2. High-grade stenosis of the proximal left posterior cerebral artery. This may represent hemorrhagic conversion of a focal infarct. 3. Disproportionate prominence of the ventricular system. Normal pressure hydrocephalus is still considered. 4. No evidence for vasculitis or previous hemorrhage. 5. Periventricular T2 changes bilaterally likely reflecting small vessel ischemic change. This could in part be related to hydrocephalus.   Carotid Doppler Carotid Duplex Completed. Mild intimal thickening with no evidence of stenosis noted in both carotid arteries. Bilateral vertebral arteries demonstrate antegrade flow.   2D Echocardiogram - Left  ventricle: The cavity size was normal. Wall thickness was normal. Systolic function was normal. The estimated ejection fraction was in the range of 55% to 60%. Wall motion was normal; there were no regional wall motion abnormalities. Doppler parameters are consistent with abnormal left ventricular relaxation (grade 1 diastolic dysfunction). The E/e&' ratio is between 8-15, suggesting indeterminate LV filling pressure. - Left atrium: The atrium was normal in size. - Atrial septum: No defect or patent foramen ovale was identified. Impressions - LVEF 55-60%, normal wall motion, diastolic dysfunction, indeterminate LV filling pressure, normal LA size, no PFO by color doppler.  Component     Latest Ref Rng 08/16/2015 08/18/2015  Cholesterol     0 - 200 mg/dL  161  Triglycerides     <150 mg/dL  56  HDL Cholesterol     >40 mg/dL  44  Total CHOL/HDL Ratio       3.7  VLDL     0 - 40 mg/dL  11  LDL (calc)     0 - 99 mg/dL  096 (H)  Hemoglobin E4V     4.8 - 5.6 %  6.0 (H)  Mean Plasma Glucose       126  Vitamin B-12     180 - 914 pg/mL 371   TSH     0.350 - 4.500 uIU/mL 0.722   Folate     >5.9 ng/mL  14.6    Assessment: As you may recall, he is a 82 y.o. African American male with PMH of poorly controlled hypertension and possible dementia admitted on 08/16/15 for left occipital hemorrhage  on CT. MRI confirmed left occipital ICH but no evidence of CAA. MRA showed no AVM. CUS, 2D echo unremarkable. LDL 109 and A1C 6.0. Pt has right hemianopia and psychomotor slowing. Put on pravastatin and discharged to home. During the interval time, he still has high BP with lisinopril 20mg  bid. Add amlodipine. Repeat CT showed ICH resolution. Put on ASA 81mg  for stroke prevention. His BP much better controlled and seems right hemianopia also improved although he is not cooperative on visual field testing in clinic. Need following up with ophthalmology.   Plan:  - continue ASA and  pravastatin for stroke prevention - increase aricept from 5mg  to 10mg   - Follow up with your primary care physician for stroke risk factor modification. Recommend maintain blood pressure goal <140/90, diabetes with hemoglobin A1c goal below 6.5% and lipids with LDL cholesterol goal below 70 mg/dL.  - follow up with ophthalmology for visual field testing - check BP at home - follow up in 6 months.  I spent more than 25 minutes of face to face time with the patient. Greater than 50% of time was spent in counseling and coordination of care. We discussed about visual field monitoring and BP control. Increased aricept dose.   No orders of the defined types were placed in this encounter.    Meds ordered this encounter  Medications  . latanoprost (XALATAN) 0.005 % ophthalmic solution    Sig: INT 1 GTT INTO OU Q NIGHT    Refill:  4  . donepezil (ARICEPT) 10 MG tablet    Sig: Take 1 tablet (10 mg total) by mouth at bedtime.    Dispense:  90 tablet    Refill:  3    Patient Instructions  - continue ASA and pravastatin for stroke prevention - increase aricept from 5mg  to 10mg   - Follow up with your primary care physician for stroke risk factor modification. Recommend maintain blood pressure goal <140/90, diabetes with hemoglobin A1c goal below 6.5% and lipids with LDL cholesterol goal below 70 mg/dL.  - follow up with ophthalmology for visual field testing - check BP at home - follow up in 6 months.    Marvel PlanJindong Marshaun Lortie, MD PhD [facility] [address], [address] [place], [place] [address] [phone]

## 2015-12-18 NOTE — Patient Instructions (Addendum)
-   continue ASA and pravastatin for stroke prevention - increase aricept from 5mg  to 10mg   - Follow up with your primary care physician for stroke risk factor modification. Recommend maintain blood pressure goal <140/90, diabetes with hemoglobin A1c goal below 6.5% and lipids with LDL cholesterol goal below 70 mg/dL.  - follow up with ophthalmology for visual field testing - check BP at home - follow up in 6 months.

## 2016-01-21 ENCOUNTER — Ambulatory Visit (INDEPENDENT_AMBULATORY_CARE_PROVIDER_SITE_OTHER): Payer: Medicaid Other | Admitting: Family Medicine

## 2016-01-21 ENCOUNTER — Encounter: Payer: Self-pay | Admitting: Family Medicine

## 2016-01-21 VITALS — BP 140/62 | HR 67 | Temp 98.3°F | Resp 16 | Ht 71.0 in | Wt 228.0 lb

## 2016-01-21 DIAGNOSIS — F0390 Unspecified dementia without behavioral disturbance: Secondary | ICD-10-CM

## 2016-01-21 DIAGNOSIS — I10 Essential (primary) hypertension: Secondary | ICD-10-CM

## 2016-01-21 LAB — COMPLETE METABOLIC PANEL WITH GFR
ALBUMIN: 3.9 g/dL (ref 3.6–5.1)
ALT: 11 U/L (ref 9–46)
AST: 21 U/L (ref 10–35)
Alkaline Phosphatase: 73 U/L (ref 40–115)
BUN: 9 mg/dL (ref 7–25)
CHLORIDE: 104 mmol/L (ref 98–110)
CO2: 28 mmol/L (ref 20–31)
CREATININE: 0.98 mg/dL (ref 0.70–1.11)
Calcium: 9.1 mg/dL (ref 8.6–10.3)
GFR, Est African American: 83 mL/min (ref >=60)
GFR, Est Non African American: 72 mL/min (ref >=60)
GLUCOSE: 148 mg/dL — AB (ref 65–99)
POTASSIUM: 3.7 mmol/L (ref 3.5–5.3)
SODIUM: 140 mmol/L (ref 135–146)
Total Bilirubin: 0.7 mg/dL (ref 0.2–1.2)
Total Protein: 7.1 g/dL (ref 6.1–8.1)

## 2016-01-21 LAB — POCT URINALYSIS DIP (DEVICE)
Bilirubin Urine: NEGATIVE
Glucose, UA: NEGATIVE mg/dL
KETONES UR: NEGATIVE mg/dL
Leukocytes, UA: NEGATIVE
Nitrite: NEGATIVE
PH: 5 (ref 5.0–8.0)
PROTEIN: NEGATIVE mg/dL
SPECIFIC GRAVITY, URINE: 1.02 (ref 1.005–1.030)
Urobilinogen, UA: 0.2 mg/dL (ref 0.0–1.0)

## 2016-01-21 MED ORDER — LISINOPRIL 20 MG PO TABS: 20.0000 mg | ORAL_TABLET | Freq: Two times a day (BID) | ORAL | Status: DC

## 2016-01-21 MED ORDER — AMLODIPINE BESYLATE 10 MG PO TABS: 10.0000 mg | ORAL_TABLET | Freq: Every day | ORAL | Status: DC

## 2016-01-21 MED ORDER — DONEPEZIL HCL 10 MG PO TABS: 10.0000 mg | ORAL_TABLET | Freq: Every day | ORAL | Status: DC

## 2016-01-21 MED FILL — AMLODIPINE BESYLATE 10 MG T: 10 | 90 days supply | Qty: 90 | Fill #1

## 2016-01-21 MED FILL — LISINOPRIL 20 MG TABLET: 20 | 30 days supply | Qty: 60 | Fill #0

## 2016-01-21 MED FILL — LISINOPRIL 20 MG TABLET: 20 | 30 days supply | Qty: 60 | Fill #1

## 2016-01-21 MED FILL — DONEPEZIL HCL 10 MG TABLET: 10 | 60 days supply | Qty: 60 | Fill #0

## 2016-01-21 MED FILL — AMLODIPINE BESYLATE 10 MG T: 10 | 30 days supply | Qty: 30 | Fill #0

## 2016-01-21 MED FILL — DONEPEZIL HCL 10 MG TABLET: 10 | 90 days supply | Qty: 90 | Fill #1

## 2016-01-21 NOTE — Progress Notes (Signed)
Subjective:    Patient ID: Barry Owens, male    DOB: [birth year], 82 y.o.   MRN: [id number]  Hypertension Pertinent negatives include no shortness of breath.   [name], an 82 year old male presents for follow up of hypertension accompanied by wife and daughter. Blood pressures have improved on medication regimen. Patient has consistently been taking medications. Patient is having blood pressure checked several times per week by daughter. Also, his family prepares his meals and ensures that he eats a low sodium, low fat diet. He denies headache, dizziness, shortness of breath, chest pains, or dyspnea. Patient is alert, oriented, and smiling.  Past Medical History  Diagnosis Date  . Stroke (HCC)   . Hypertension   . Cataract    Social History   Social History  . Marital Status: Married    Spouse Name: N/A  . Number of Children: N/A  . Years of Education: N/A   Occupational History  . Not on file.   Social History Main Topics  . Smoking status: Former Smoker    Types: Cigarettes    Quit date: 04/30/1987  . Smokeless tobacco: Not on file  . Alcohol Use: No  . Drug Use: No  . Sexual Activity: Not on file   Other Topics Concern  . Not on file   Social History Narrative     Medication List       This list is accurate as of: 01/21/16  8:41 AM.  Always use your most recent med list.               acetaminophen 650 MG CR tablet  Commonly known as:  TYLENOL  Take 1,300 mg by mouth every 8 (eight) hours as needed for pain.     amLODipine 10 MG tablet  Commonly known as:  NORVASC  Take 1 tablet (10 mg total) by mouth daily.     aspirin EC 81 MG tablet  Take 1 tablet (81 mg total) by mouth daily. (Patient may obtain OTC)     clotrimazole-betamethasone cream  Commonly known as:  LOTRISONE  Apply 1 application topically 2 (two) times daily.     donepezil 10 MG tablet  Commonly known as:  ARICEPT  Take 1 tablet (10 mg total) by mouth at bedtime.      furosemide 20 MG tablet  Commonly known as:  LASIX  Take 1 tablet (20 mg total) by mouth daily.     latanoprost 0.005 % ophthalmic solution  Commonly known as:  XALATAN  INT 1 GTT INTO OU Q NIGHT     lisinopril 20 MG tablet  Commonly known as:  PRINIVIL,ZESTRIL  Take 1 tablet (20 mg total) by mouth 2 (two) times daily.     multivitamin tablet  Take 1 tablet by mouth daily.     pravastatin 20 MG tablet  Commonly known as:  PRAVACHOL  Take 1 tablet (20 mg total) by mouth daily at 6 PM.       Review of Systems  Constitutional: Negative for fever.  HENT: Negative.   Eyes: Negative for photophobia.  Respiratory: Negative.  Negative for shortness of breath.   Cardiovascular: Negative.   Gastrointestinal: Negative.   Endocrine: Negative for polydipsia, polyphagia and polyuria.  Genitourinary: Negative.  Negative for urgency, flank pain and difficulty urinating.  Allergic/Immunologic: Negative.   Neurological: Negative.  Negative for tremors, weakness and numbness.  Hematological: Negative.   Psychiatric/Behavioral: Negative for hallucinations and sleep disturbance. Confusion: Cognition has  improved on Aricept. The patient is not hyperactive.        Family states that he occasionally becomes fixated on specific tasks or past events. He is also under the care of neurology.        Objective:   Physical Exam  Constitutional: He is oriented to person, place, and time. He appears well-developed and well-nourished.  HENT:  Head: Normocephalic and atraumatic.  Right Ear: External ear normal.  Left Ear: External ear normal.  Mouth/Throat: Oropharynx is clear and moist.  Eyes: Conjunctivae and EOM are normal. Pupils are equal, round, and reactive to light.  Neck: Normal range of motion. Neck supple.  Cardiovascular: Normal rate, regular rhythm, normal heart sounds and intact distal pulses.   Pulmonary/Chest: Effort normal and breath sounds normal.  Abdominal: Soft. Bowel sounds  are normal.  Musculoskeletal: Normal range of motion.  Neurological: He is alert and oriented to person, place, and time. He has normal reflexes.  Skin: Skin is warm and dry.  Psychiatric: He has a normal mood and affect. His behavior is normal. Judgment and thought content normal.       BP 167/58 mmHg  Pulse 67  Temp(Src) 98.3 F (36.8 C) (Oral)  Resp 16  Ht  (1.803 m)  Wt 228 lb (103.42 kg)  BMI 31.81 kg/m2 Assessment & Plan:   1. Essential hypertension Blood pressure is at goal on current regimen. Will continue medications at current dosages. Encouraged his family to continue low sodium diet and encourage him to increase fluid intake  - COMPLETE METABOLIC PANEL WITH GFR - POCT urinalysis dipstick  2. Dementia, without behavioral disturbance Family reports that cognition has improved with Aricept. State that behaviors have improved. Marland Kitchen He answered all questions appropriately and responds to commands with minimal assistance.   3. History of stroke Patient is to follow up with Dr. Roda Shutters in 6 months. . Reviewed previous notes.   The patient was given clear instructions to go to ER or return to medical center if symptoms do not improve, worsen or new problems develop. The patient verbalized understanding.   RTC: 3 months for hypertension.  Massie Maroon, FNP

## 2016-01-21 NOTE — Patient Instructions (Addendum)
Patient to return to office in 3 months for hypertension.  Blood pressure is controlled on current medication regimen.  Continue to follow a lowfat, low sodium diet.     DASH Eating Plan DASH stands for "Dietary Approaches to Stop Hypertension." The DASH eating plan is a healthy eating plan that has been shown to reduce high blood pressure (hypertension). Additional health benefits may include reducing the risk of type 2 diabetes mellitus, heart disease, and stroke. The DASH eating plan may also help with weight loss. WHAT DO I NEED TO KNOW ABOUT THE DASH EATING PLAN? For the DASH eating plan, you will follow these general guidelines:  Choose foods with a percent daily value for sodium of less than 5% (as listed on the food label).  Use salt-free seasonings or herbs instead of table salt or sea salt.  Check with your health care provider or pharmacist before using salt substitutes.  Eat lower-sodium products, often labeled as "lower sodium" or "no salt added."  Eat fresh foods.  Eat more vegetables, fruits, and low-fat dairy products.  Choose whole grains. Look for the word "whole" as the first word in the ingredient list.  Choose fish and skinless chicken or [place] more often than red meat. Limit fish, poultry, and meat to 6 oz (170 g) each day.  Limit sweets, desserts, sugars, and sugary drinks.  Choose heart-healthy fats.  Limit cheese to 1 oz (28 g) per day.  Eat more home-cooked food and less restaurant, buffet, and fast food.  Limit fried foods.  Cook foods using methods other than frying.  Limit canned vegetables. If you do use them, rinse them well to decrease the sodium.  When eating at a restaurant, ask that your food be prepared with less salt, or no salt if possible. WHAT FOODS CAN I EAT? Seek help from a dietitian for individual calorie needs. Grains Whole grain or whole wheat bread. Brown rice. Whole grain or whole wheat pasta. Quinoa, bulgur, and whole  grain cereals. Low-sodium cereals. Corn or whole wheat flour tortillas. Whole grain cornbread. Whole grain crackers. Low-sodium crackers. Vegetables Fresh or frozen vegetables (raw, steamed, roasted, or grilled). Low-sodium or reduced-sodium tomato and vegetable juices. Low-sodium or reduced-sodium tomato sauce and paste. Low-sodium or reduced-sodium canned vegetables.  Fruits All fresh, canned (in natural juice), or frozen fruits. Meat and Other Protein Products Ground beef (85% or leaner), grass-fed beef, or beef trimmed of fat. Skinless chicken or [place]. Ground chicken or [place]. Pork trimmed of fat. All fish and seafood. Eggs. Dried beans, peas, or lentils. Unsalted nuts and seeds. Unsalted canned beans. Dairy Low-fat dairy products, such as skim or 1% milk, 2% or reduced-fat cheeses, low-fat ricotta or cottage cheese, or plain low-fat yogurt. Low-sodium or reduced-sodium cheeses. Fats and Oils Tub margarines without trans fats. Light or reduced-fat mayonnaise and salad dressings (reduced sodium). Avocado. Safflower, olive, or canola oils. Natural peanut or almond butter. Other Unsalted popcorn and pretzels. The items listed above may not be a complete list of recommended foods or beverages. Contact your dietitian for more options. WHAT FOODS ARE NOT RECOMMENDED? Grains White bread. White pasta. White rice. Refined cornbread. Bagels and croissants. Crackers that contain trans fat. Vegetables Creamed or fried vegetables. Vegetables in a cheese sauce. Regular canned vegetables. Regular canned tomato sauce and paste. Regular tomato and vegetable juices. Fruits Dried fruits. Canned fruit in light or heavy syrup. Fruit juice. Meat and Other Protein Products Fatty cuts of meat. Ribs, chicken wings, bacon, sausage, bologna, salami,  chitterlings, fatback, hot dogs, bratwurst, and packaged luncheon meats. Salted nuts and seeds. Canned beans with salt. Dairy Whole or 2% milk, cream, half-and-half,  and cream cheese. Whole-fat or sweetened yogurt. Full-fat cheeses or blue cheese. Nondairy creamers and whipped toppings. Processed cheese, cheese spreads, or cheese curds. Condiments Onion and garlic salt, seasoned salt, table salt, and sea salt. Canned and packaged gravies. Worcestershire sauce. Tartar sauce. Barbecue sauce. Teriyaki sauce. Soy sauce, including reduced sodium. Steak sauce. Fish sauce. Oyster sauce. Cocktail sauce. Horseradish. Ketchup and mustard. Meat flavorings and tenderizers. Bouillon cubes. Hot sauce. Tabasco sauce. Marinades. Taco seasonings. Relishes. Fats and Oils Butter, stick margarine, lard, shortening, ghee, and bacon fat. Coconut, palm kernel, or palm oils. Regular salad dressings. Other Pickles and olives. Salted popcorn and pretzels. The items listed above may not be a complete list of foods and beverages to avoid. Contact your dietitian for more information. WHERE CAN I FIND MORE INFORMATION? National Heart, Lung, and Blood Institute: travelstabloid.com   This information is not intended to replace advice given to you by your health care provider. Make sure you discuss any questions you have with your health care provider.   Document Released: 11/11/2011 Document Revised: 12/13/2014 Document Reviewed: 09/26/2013 Elsevier Interactive Patient Education [employer].

## 2016-01-23 MED FILL — LATANOPROST 0.005% EYE DRP: 0.005 | 89 days supply | Qty: 8 | Fill #0

## 2016-01-26 ENCOUNTER — Telehealth: Payer: Self-pay | Admitting: *Deleted

## 2016-01-26 ENCOUNTER — Other Ambulatory Visit: Payer: Self-pay

## 2016-01-26 DIAGNOSIS — R600 Localized edema: Secondary | ICD-10-CM

## 2016-01-26 MED ORDER — FUROSEMIDE 20 MG PO TABS: 20.0000 mg | ORAL_TABLET | Freq: Every day | ORAL | Status: DC

## 2016-01-26 NOTE — Telephone Encounter (Signed)
Pt daughter called and states that pt needs a refill of his lasix ( 3 mth supply) . [place] and [place] refill today. Pt daughter request if the medication can be called in the [facility] on [address]. Thanks

## 2016-01-26 NOTE — Telephone Encounter (Signed)
This has been sent to correct pharmacy. Thanks!  

## 2016-01-26 NOTE — Telephone Encounter (Signed)
Lasix has been sent into pharmacy. Thanks !

## 2016-01-30 ENCOUNTER — Other Ambulatory Visit: Payer: Self-pay | Admitting: Family Medicine

## 2016-02-27 ENCOUNTER — Ambulatory Visit: Payer: Medicaid Other | Admitting: Family Medicine

## 2016-06-04 ENCOUNTER — Ambulatory Visit: Payer: Medicaid Other | Admitting: Family Medicine

## 2016-07-01 MED FILL — DONEPEZIL HCL 10 MG TABLET: 10 | 30 days supply | Qty: 30 | Fill #2

## 2016-07-01 MED FILL — AMLODIPINE BESYLATE 10 MG T: 10 | 60 days supply | Qty: 60 | Fill #2

## 2016-07-01 MED FILL — LISINOPRIL 20 MG TABLET: 20 | 90 days supply | Qty: 180 | Fill #2

## 2016-07-09 ENCOUNTER — Other Ambulatory Visit: Payer: Self-pay

## 2016-07-09 ENCOUNTER — Ambulatory Visit (INDEPENDENT_AMBULATORY_CARE_PROVIDER_SITE_OTHER): Payer: Self-pay | Admitting: Family Medicine

## 2016-07-09 DIAGNOSIS — I10 Essential (primary) hypertension: Secondary | ICD-10-CM

## 2016-07-09 DIAGNOSIS — Z8673 Personal history of transient ischemic attack (TIA), and cerebral infarction without residual deficits: Secondary | ICD-10-CM

## 2016-07-09 DIAGNOSIS — R600 Localized edema: Secondary | ICD-10-CM

## 2016-07-09 DIAGNOSIS — F0390 Unspecified dementia without behavioral disturbance: Secondary | ICD-10-CM

## 2016-07-09 LAB — CBC WITH DIFFERENTIAL/PLATELET
BASOS PCT: 1 %
Basophils Absolute: 61 cells/uL (ref 0–200)
EOS PCT: 3 %
Eosinophils Absolute: 183 cells/uL (ref 15–500)
HEMATOCRIT: 40.8 % (ref 38.5–50.0)
Hemoglobin: 12.9 g/dL — ABNORMAL LOW (ref 13.2–17.1)
LYMPHS PCT: 39 %
Lymphs Abs: 2379 cells/uL (ref 850–3900)
MCH: 26 pg — ABNORMAL LOW (ref 27.0–33.0)
MCHC: 31.6 g/dL — AB (ref 32.0–36.0)
MCV: 82.1 fL (ref 80.0–100.0)
MONO ABS: 793 {cells}/uL (ref 200–950)
MONOS PCT: 13 %
MPV: 9.8 fL (ref 7.5–12.5)
NEUTROS PCT: 44 %
Neutro Abs: 2684 cells/uL (ref 1500–7800)
PLATELETS: 286 10*3/uL (ref 140–400)
RBC: 4.97 MIL/uL (ref 4.20–5.80)
RDW: 18 % — AB (ref 11.0–15.0)
WBC: 6.1 10*3/uL (ref 3.8–10.8)

## 2016-07-09 LAB — COMPLETE METABOLIC PANEL WITH GFR
ALBUMIN: 3.9 g/dL (ref 3.6–5.1)
ALK PHOS: 84 U/L (ref 40–115)
ALT: 10 U/L (ref 9–46)
AST: 18 U/L (ref 10–35)
BILIRUBIN TOTAL: 0.6 mg/dL (ref 0.2–1.2)
BUN: 6 mg/dL — AB (ref 7–25)
CALCIUM: 9 mg/dL (ref 8.6–10.3)
CHLORIDE: 104 mmol/L (ref 98–110)
CO2: 28 mmol/L (ref 20–31)
CREATININE: 0.94 mg/dL (ref 0.70–1.11)
GFR, EST AFRICAN AMERICAN: 87 mL/min (ref >=60)
GFR, Est Non African American: 75 mL/min (ref >=60)
Glucose, Bld: 83 mg/dL (ref 65–99)
Potassium: 4.2 mmol/L (ref 3.5–5.3)
Sodium: 141 mmol/L (ref 135–146)
Total Protein: 7.1 g/dL (ref 6.1–8.1)

## 2016-07-09 LAB — LIPID PANEL
Cholesterol: 151 mg/dL (ref 125–200)
HDL: 32 mg/dL — AB (ref >=40)
LDL CALC: 100 mg/dL (ref <130)
TRIGLYCERIDES: 96 mg/dL (ref <150)
Total CHOL/HDL Ratio: 4.7 Ratio (ref <=5.0)
VLDL: 19 mg/dL (ref <30)

## 2016-07-09 MED ORDER — DONEPEZIL HCL 10 MG PO TABS: 10.0000 mg | ORAL_TABLET | Freq: Every day | ORAL | 1 refills | Status: DC

## 2016-07-09 MED ORDER — ASPIRIN EC 81 MG PO TBEC: 81.0000 mg | DELAYED_RELEASE_TABLET | Freq: Every day | ORAL | 0 refills | Status: DC

## 2016-07-09 MED ORDER — LATANOPROST 0.005 % OP SOLN: OPHTHALMIC | 4 refills | Status: DC

## 2016-07-09 MED ORDER — PRAVASTATIN SODIUM 20 MG PO TABS: 20.0000 mg | ORAL_TABLET | Freq: Every day | ORAL | 3 refills | Status: DC

## 2016-07-09 MED ORDER — AMLODIPINE BESYLATE 10 MG PO TABS: 10.0000 mg | ORAL_TABLET | Freq: Every day | ORAL | 1 refills | Status: DC

## 2016-07-09 MED ORDER — PRAVASTATIN SODIUM 20 MG PO TABS
20.0000 mg | ORAL_TABLET | Freq: Every day | ORAL | 3 refills | Status: DC
Start: 2016-07-09 — End: 2016-07-09

## 2016-07-09 MED ORDER — LISINOPRIL 20 MG PO TABS: 20.0000 mg | ORAL_TABLET | Freq: Two times a day (BID) | ORAL | 1 refills | Status: DC

## 2016-07-09 MED ORDER — FUROSEMIDE 20 MG PO TABS: 20.0000 mg | ORAL_TABLET | Freq: Every day | ORAL | 3 refills | Status: DC

## 2016-07-09 MED ORDER — LISINOPRIL 20 MG PO TABS
20.0000 mg | ORAL_TABLET | Freq: Two times a day (BID) | ORAL | 1 refills | Status: DC
Start: 2016-07-09 — End: 2016-07-09

## 2016-07-09 MED ORDER — ASPIRIN EC 81 MG PO TBEC
81.0000 mg | DELAYED_RELEASE_TABLET | Freq: Every day | ORAL | 0 refills | Status: DC
Start: 2016-07-09 — End: 2016-10-11

## 2016-07-12 MED FILL — PRAVASTATIN NA 20 MG TAB: 20 | 30 days supply | Qty: 30 | Fill #0

## 2016-07-12 MED FILL — LATANOPROST 0.005% EYE DRP: 0.005 | 18 days supply | Qty: 3 | Fill #0

## 2016-07-19 NOTE — Progress Notes (Signed)
Barry Owens, is a 82 y.o. male  [id number]  [id number]  DOB - 02/08/1931  CC:  Chief Complaint  Patient presents with  . Hypertension       HPI: Barry Owens is a [age] y.o. male here to establish care. He is accomplanied by two family members who speak for him as he suffers with dementia. He has a history of hypertension, previous CVA, dementia, hyperlipidemia. He is currently on  Aricept 10, lsinopril 20, amlodipine, Xalatan, Pravastatin. He is needing refills. His family reports no significant change since his last visit. They report he eats a low salt diet and does some walking several times a week.  No Known Allergies Past Medical History:  Diagnosis Date  . Cataract   . Hypertension   . Stroke [facility](HCC)    Current Outpatient Prescriptions on File Prior to Visit  Medication Sig Dispense Refill  . acetaminophen (TYLENOL) 650 MG CR tablet Take 1,300 mg by mouth every 8 (eight) hours as needed for pain.    . clotrimazole-betamethasone (LOTRISONE) cream Apply 1 application topically 2 (two) times daily. 45 g 3  . Multiple Vitamin (MULTIVITAMIN) tablet Take 1 tablet by mouth daily.     No current facility-administered medications on file prior to visit.    Family History  Problem Relation Age of Onset  . Hypertension Mother    Social History   Social History  . Marital status: Married    Spouse name: N/A  . Number of children: N/A  . Years of education: N/A   Occupational History  . Not on file.   Social History Main Topics  . Smoking status: Former Smoker    Types: Cigarettes    Quit date: 04/30/1987  . Smokeless tobacco: Not on file  . Alcohol use No  . Drug use: No  . Sexual activity: Not on file   Other Topics Concern  . Not on file   Social History Narrative  . No narrative on file    Review of Systems: Constitutional: Negative for fever, chills, appetite change, weight loss,  Fatigue. Skin: Negative for rashes or lesions of  concern. HENT: Negative for ear pain, ear discharge.nose bleeds. Decline in hearing.  Eyes: Negative for pain, discharge, redness, itching and visual disturbance. Neck: Negative for pain, stiffness Respiratory: Negative for cough, shortness of breath,   Cardiovascular: Negative for chest pain, palpitations and leg swelling. Gastrointestinal: Negative for abdominal pain, nausea, vomiting, diarrhea, constipations Genitourinary: Negative for dysuria, urgency, frequency, hematuria,  Musculoskeletal: Negative for back pain, joint pain, joint  swelling, and gait problem.Negative for weakness. Neurological: Negative for dizziness, tremors, seizures, syncope,   light-headedness, numbness and headaches.  Hematological: Negative for easy bruising or bleeding Psychiatric/Behavioral: Negative for depression, anxiety, decreased concentration, confusion   Objective:   Vitals:   07/09/16 1305  BP: (!) 158/54  Pulse: 63  Resp: 14  Temp: 97.6 F (36.4 C)    Physical Exam: Constitutional: Patient appears well-developed and well-nourished. No distress. A decline in hearing is obvious HENT: Normocephalic, atraumatic, External right and left ear normal. Oropharynx is clear and moist.  Eyes: Conjunctivae and EOM are normal. PERRLA, no scleral icterus. Neck: Normal ROM. Neck supple. No lymphadenopathy, No thyromegaly. CVS: RRR, S1/S2 +, no murmurs, no gallops, no rubs Pulmonary: Effort and breath sounds normal, no stridor, rhonchi, wheezes, rales.  Abdominal: Soft. Normoactive BS,, no distension, tenderness, rebound or guarding.  Musculoskeletal: Normal range of motion. No edema and no tenderness.  Neuro: Alert.Normal muscle tone coordination.  Non-focal Skin: Skin is warm and dry. No rash noted. Not diaphoretic. No erythema. No pallor. Psychiatric: Normal mood and affect.His family answers most questions for him.  Lab Results  Component Value Date   WBC 6.1 07/09/2016   HGB 12.9 (L) 07/09/2016    HCT 40.8 07/09/2016   MCV 82.1 07/09/2016   PLT 286 07/09/2016   Lab Results  Component Value Date   CREATININE 0.94 07/09/2016   BUN 6 (L) 07/09/2016   NA 141 07/09/2016   K 4.2 07/09/2016   CL 104 07/09/2016   CO2 28 07/09/2016    Lab Results  Component Value Date   HGBA1C 6.0 (H) 08/18/2015   Lipid Panel     Component Value Date/Time   CHOL 151 07/09/2016 1337   TRIG 96 07/09/2016 1337   HDL 32 (L) 07/09/2016 1337   CHOLHDL 4.7 07/09/2016 1337   VLDL 19 07/09/2016 1337   LDLCALC 100 07/09/2016 1337       Assessment and plan:   1. Dementia, without behavioral disturbance -Follow-up as needed  2. Essential hypertension  - COMPLETE METABOLIC PANEL WITH GFR - CBC with Differential - Lipid panel Refill of lisinopril and amlodipine.  3. Pedal edema  - furosemide (LASIX) 20 MG tablet; Take 1 tablet (20 mg total) by mouth daily.  Dispense: 30 tablet; Refill: 3  4. Hypercholesterolemia -Refill of pravastatin  5. Glaucoma -Refill of eye gtts.  6. Dementia -Refil of aricept.      Return in about 3 months (around 10/09/2016).  The patient was given clear instructions to go to ER or return to medical center if symptoms don't improve, worsen or new problems develop. The patient verbalized understanding.    Henrietta HooverLinda C Bernhardt FNP  07/19/2016, 2:53 PM

## 2016-08-12 ENCOUNTER — Other Ambulatory Visit: Payer: Self-pay | Admitting: Family Medicine

## 2016-08-12 DIAGNOSIS — I10 Essential (primary) hypertension: Secondary | ICD-10-CM

## 2016-08-12 DIAGNOSIS — F0390 Unspecified dementia without behavioral disturbance: Secondary | ICD-10-CM

## 2016-08-12 MED FILL — PRAVASTATIN NA 20 MG TAB: 20 | 30 days supply | Qty: 30 | Fill #1

## 2016-08-16 MED FILL — DONEPEZIL HCL 10 MG TABLET: 10 | 30 days supply | Qty: 30 | Fill #0

## 2016-08-18 MED FILL — AMLODIPINE BESYLATE 10 MG T: 10 | 30 days supply | Qty: 30 | Fill #0

## 2016-08-18 MED FILL — LISINOPRIL 20 MG TABLET: 20 | 30 days supply | Qty: 60 | Fill #0

## 2016-08-18 MED FILL — LATANOPROST 0.005% EYE DRP: 0.005 | 18 days supply | Qty: 3 | Fill #1

## 2016-08-27 ENCOUNTER — Ambulatory Visit: Payer: Self-pay | Admitting: Neurology

## 2016-09-15 ENCOUNTER — Other Ambulatory Visit: Payer: Self-pay | Admitting: Family Medicine

## 2016-09-15 DIAGNOSIS — R600 Localized edema: Secondary | ICD-10-CM

## 2016-09-16 MED FILL — PRAVASTATIN NA 20 MG TAB: 20 | 30 days supply | Qty: 30 | Fill #2

## 2016-09-16 MED FILL — DONEPEZIL HCL 10 MG TABLET: 10 | 30 days supply | Qty: 30 | Fill #1

## 2016-09-16 MED FILL — ?AMLODIPINE BESYLATE 10 MG: 10 | 30 days supply | Qty: 30 | Fill #1

## 2016-09-22 ENCOUNTER — Telehealth: Payer: Self-pay

## 2016-09-22 DIAGNOSIS — I10 Essential (primary) hypertension: Secondary | ICD-10-CM

## 2016-09-22 MED ORDER — PRAVASTATIN SODIUM 20 MG PO TABS: 20.0000 mg | ORAL_TABLET | Freq: Every day | ORAL | 3 refills | Status: DC

## 2016-09-22 NOTE — Telephone Encounter (Signed)
Refill sent into pharmacy. Thanks!  

## 2016-10-11 ENCOUNTER — Ambulatory Visit (INDEPENDENT_AMBULATORY_CARE_PROVIDER_SITE_OTHER): Payer: Self-pay | Admitting: Family Medicine

## 2016-10-11 ENCOUNTER — Encounter: Payer: Self-pay | Admitting: Family Medicine

## 2016-10-11 DIAGNOSIS — I10 Essential (primary) hypertension: Secondary | ICD-10-CM

## 2016-10-11 DIAGNOSIS — Z23 Encounter for immunization: Secondary | ICD-10-CM

## 2016-10-11 DIAGNOSIS — R600 Localized edema: Secondary | ICD-10-CM

## 2016-10-11 DIAGNOSIS — Z8673 Personal history of transient ischemic attack (TIA), and cerebral infarction without residual deficits: Secondary | ICD-10-CM

## 2016-10-11 MED ORDER — LATANOPROST 0.005 % OP SOLN: OPHTHALMIC | 11 refills | Status: DC

## 2016-10-11 MED ORDER — AMLODIPINE BESYLATE 10 MG PO TABS: 10.0000 mg | ORAL_TABLET | Freq: Every day | ORAL | 3 refills | Status: DC

## 2016-10-11 MED ORDER — DONEPEZIL HCL 10 MG PO TABS: 10.0000 mg | ORAL_TABLET | Freq: Every day | ORAL | 3 refills | Status: DC

## 2016-10-11 MED ORDER — FUROSEMIDE 20 MG PO TABS: 20.0000 mg | ORAL_TABLET | Freq: Every day | ORAL | 3 refills | Status: DC

## 2016-10-11 MED ORDER — PRAVASTATIN SODIUM 20 MG PO TABS: 20.0000 mg | ORAL_TABLET | Freq: Every day | ORAL | 3 refills | Status: DC

## 2016-10-11 MED ORDER — ASPIRIN EC 81 MG PO TBEC: 81.0000 mg | DELAYED_RELEASE_TABLET | Freq: Every day | ORAL | 3 refills | Status: DC

## 2016-10-11 MED ORDER — LISINOPRIL 20 MG PO TABS: 20.0000 mg | ORAL_TABLET | Freq: Two times a day (BID) | ORAL | 3 refills | Status: DC

## 2016-10-11 MED FILL — PRAVASTATIN NA 20 MG TAB: 20 | 180 days supply | Qty: 180 | Fill #0

## 2016-10-11 MED FILL — DONEPEZIL HCL 10 MG TABLET: 10 | 180 days supply | Qty: 180 | Fill #0

## 2016-10-11 MED FILL — FUROSEMIDE 20 MG TABLET: 20 | 180 days supply | Qty: 180 | Fill #0

## 2016-10-11 MED FILL — LATANOPROST 0.005% EYE DRP: 0.005 | 180 days supply | Qty: 15 | Fill #0

## 2016-10-11 MED FILL — AMLODIPINE BESYLATE 10 MG T: 10 | 180 days supply | Qty: 180 | Fill #0

## 2016-10-11 MED FILL — LISINOPRIL 20 MG TABLET: 20 | 180 days supply | Qty: 360 | Fill #0

## 2016-10-11 NOTE — Patient Instructions (Signed)
Would recommend alternating tylenol and ibuprofen for pain.

## 2016-10-12 NOTE — Progress Notes (Signed)
Barry Owens, is a 82 y.o. male  [id number]  [id number]  DOB - [birth year]  CC:  Chief Complaint  Patient presents with  . follow up    going back to [place], will need refill on all meds  . Back Pain    low back pain persists       HPI: Barry Owens is a 82 y.o. male here for routine follow-up. He has a history of hypertension, CVA, cataracts and dementia. He is accompanied by his wife and daughter. There main reason for being here today was to get a years refill of his medications as he is going to [place] for a year in the next couple of weeks. He is on Baby ASA, pravastatin, amlodipine, lasix, lisinopril, aricept and latanaprost. He complains today of continue musculoskeletal pain (arthritis). We have discusses use of OTC medications to help control pain.   Health maintenance: Declines flu shot today, but accepts pneumococcal 13.    No Known Allergies Past Medical History:  Diagnosis Date  . Cataract   . Hypertension   . Stroke [facility](HCC)    Current Outpatient Prescriptions on File Prior to Visit  Medication Sig Dispense Refill  . acetaminophen (TYLENOL) 650 MG CR tablet Take 1,300 mg by mouth every 8 (eight) hours as needed for pain.    . Multiple Vitamin (MULTIVITAMIN) tablet Take 1 tablet by mouth daily.    . clotrimazole-betamethasone (LOTRISONE) cream Apply 1 application topically 2 (two) times daily. (Patient not taking: Reported on 10/11/2016) 45 g 3   No current facility-administered medications on file prior to visit.    Family History  Problem Relation Age of Onset  . Hypertension Mother    Social History   Social History  . Marital status: Married    Spouse name: N/A  . Number of children: N/A  . Years of education: N/A   Occupational History  . Not on file.   Social History Main Topics  . Smoking status: Former Smoker    Types: Cigarettes    Quit date: 04/30/1987  . Smokeless tobacco: Not on file  . Alcohol use No  . Drug use: No  .  Sexual activity: Not on file   Other Topics Concern  . Not on file   Social History Narrative  . No narrative on file    Review of Systems: Constitutional: Negative Skin: Negative HENT: Negative  Eyes: + for glaucoma Neck: Negative Respiratory: Negative Cardiovascular: Negative Gastrointestinal: Negative Genitourinary: + for frequency  Musculoskeletal: + for back and knee pain Neurological: Negative for Hematological: Negative  Psychiatric/Behavioral: Negative    Objective:   Vitals:   10/11/16 1513  BP: (!) 142/52  Pulse: 61  Resp: 12  Temp: 98 F (36.7 C)    Physical Exam: Constitutional: Patient appears well-developed and well-nourished. No distress. HENT: Normocephalic, atraumatic, External right and left ear normal. Oropharynx is clear and moist.  Eyes: Conjunctivae and EOM are normal. PERRLA, no scleral icterus. Neck: Normal ROM. Neck supple. No lymphadenopathy, No thyromegaly. CVS: RRR, S1/S2 +, no murmurs, no gallops, no rubs Pulmonary: Effort and breath sounds normal, no stridor, rhonchi, wheezes, rales.  Abdominal: Soft. Normoactive BS,, no distension, tenderness, rebound or guarding.  Musculoskeletal: Normal range of motion. No edema and no tenderness.  Neuro: Alert.Normal muscle tone coordination. Non-focal Skin: Skin is warm and dry. No rash noted. Not diaphoretic. No erythema. No pallor. Psychiatric: Normal mood and affect. Behavior, judgment, thought content normal.  Lab Results  Component Value Date  WBC 6.1 07/09/2016   HGB 12.9 (L) 07/09/2016   HCT 40.8 07/09/2016   MCV 82.1 07/09/2016   PLT 286 07/09/2016   Lab Results  Component Value Date   CREATININE 0.94 07/09/2016   BUN 6 (L) 07/09/2016   NA 141 07/09/2016   K 4.2 07/09/2016   CL 104 07/09/2016   CO2 28 07/09/2016    Lab Results  Component Value Date   HGBA1C 6.0 (H) 08/18/2015   Lipid Panel     Component Value Date/Time   CHOL 151 07/09/2016 1337   TRIG 96 07/09/2016  1337   HDL 32 (L) 07/09/2016 1337   CHOLHDL 4.7 07/09/2016 1337   VLDL 19 07/09/2016 1337   LDLCALC 100 07/09/2016 1337        Assessment and plan:   1. History of stroke  - aspirin EC 81 MG tablet; Take 1 tablet (81 mg total) by mouth daily. (Patient may obtain OTC)  Dispense: 100 tablet; Refill: 3  2. Essential hypertension  - pravastatin (PRAVACHOL) 20 MG tablet; Take 1 tablet (20 mg total) by mouth daily at 6 PM.  Dispense: 90 tablet; Refill: 3 - lisinopril (PRINIVIL,ZESTRIL) 20 MG tablet; Take 1 tablet (20 mg total) by mouth 2 (two) times daily.  Dispense: 180 tablet; Refill: 3 - amLODipine (NORVASC) 10 MG tablet; Take 1 tablet (10 mg total) by mouth daily.  Dispense: 90 tablet; Refill: 3  3. Pedal edema  - furosemide (LASIX) 20 MG tablet; Take 1 tablet (20 mg total) by mouth daily.  Dispense: 90 tablet; Refill: 3  4. Dementia- -Refill of aricept, year supply  5. Glaucoma -Refill of lantaprost, year supply   Return in about 1 year (around 10/11/2017).  The patient was given clear instructions to go to ER or return to medical center if symptoms don't improve, worsen or new problems develop. The patient verbalized understanding.    Henrietta HooverLinda C Edwards Mckelvie FNP  10/12/2016, 3:42 PM

## 2017-09-06 ENCOUNTER — Other Ambulatory Visit: Payer: Self-pay | Admitting: Family Medicine

## 2017-09-06 DIAGNOSIS — I10 Essential (primary) hypertension: Secondary | ICD-10-CM

## 2017-09-06 MED FILL — DONEPEZIL HCL 10 MG TABLET: 10 | 180 days supply | Qty: 180 | Fill #1

## 2017-09-06 MED FILL — PRAVASTATIN NA 20 MG TAB: 20 | 180 days supply | Qty: 180 | Fill #1

## 2017-09-06 MED FILL — AMLODIPINE BESYLATE 10 MG T: 10 | 180 days supply | Qty: 180 | Fill #1

## 2017-09-06 MED FILL — LISINOPRIL 20 MG TABLET: 20 | 30 days supply | Qty: 60 | Fill #0

## 2017-09-06 MED FILL — FUROSEMIDE 20 MG TABLET: 20 | 180 days supply | Qty: 180 | Fill #1

## 2017-09-13 ENCOUNTER — Ambulatory Visit (INDEPENDENT_AMBULATORY_CARE_PROVIDER_SITE_OTHER): Payer: Self-pay | Admitting: Urgent Care

## 2017-09-13 DIAGNOSIS — Z23 Encounter for immunization: Secondary | ICD-10-CM

## 2017-10-07 MED FILL — LISINOPRIL 20 MG TAB: 20 | 30 days supply | Qty: 60 | Fill #1

## 2017-11-16 ENCOUNTER — Other Ambulatory Visit: Payer: Self-pay | Admitting: Family Medicine

## 2017-11-16 DIAGNOSIS — I10 Essential (primary) hypertension: Secondary | ICD-10-CM

## 2017-12-05 ENCOUNTER — Other Ambulatory Visit: Payer: Self-pay | Admitting: Family Medicine

## 2017-12-05 DIAGNOSIS — I10 Essential (primary) hypertension: Secondary | ICD-10-CM

## 2017-12-05 MED FILL — LISINOPRIL 20 MG TAB: 20 | 30 days supply | Qty: 60 | Fill #0

## 2017-12-07 MED ORDER — LISINOPRIL 20 MG PO TABS: 20.0000 mg | ORAL_TABLET | Freq: Two times a day (BID) | ORAL | 0 refills | Status: DC

## 2018-01-02 ENCOUNTER — Other Ambulatory Visit: Payer: Self-pay | Admitting: Family Medicine

## 2018-01-02 DIAGNOSIS — I10 Essential (primary) hypertension: Secondary | ICD-10-CM

## 2018-01-02 MED FILL — LISINOPRIL 20 MG TAB: 20 | 30 days supply | Qty: 60 | Fill #0

## 2018-01-30 ENCOUNTER — Other Ambulatory Visit: Payer: Self-pay | Admitting: Family Medicine

## 2018-01-30 DIAGNOSIS — I10 Essential (primary) hypertension: Secondary | ICD-10-CM

## 2018-02-03 ENCOUNTER — Encounter: Payer: Self-pay | Admitting: Family Medicine

## 2018-02-03 ENCOUNTER — Ambulatory Visit (INDEPENDENT_AMBULATORY_CARE_PROVIDER_SITE_OTHER): Payer: Self-pay | Admitting: Family Medicine

## 2018-02-03 ENCOUNTER — Other Ambulatory Visit: Payer: Self-pay

## 2018-02-03 VITALS — BP 122/48 | HR 61 | Temp 97.9°F | Wt 211.0 lb

## 2018-02-03 DIAGNOSIS — Z8673 Personal history of transient ischemic attack (TIA), and cerebral infarction without residual deficits: Secondary | ICD-10-CM

## 2018-02-03 DIAGNOSIS — F0390 Unspecified dementia without behavioral disturbance: Secondary | ICD-10-CM

## 2018-02-03 DIAGNOSIS — I10 Essential (primary) hypertension: Secondary | ICD-10-CM

## 2018-02-03 LAB — POCT URINALYSIS DIP (DEVICE)
Bilirubin Urine: NEGATIVE
GLUCOSE, UA: NEGATIVE mg/dL
Ketones, ur: NEGATIVE mg/dL
NITRITE: NEGATIVE
PROTEIN: NEGATIVE mg/dL
Specific Gravity, Urine: 1.025 (ref 1.005–1.030)
UROBILINOGEN UA: 0.2 mg/dL (ref 0.0–1.0)
pH: 5.5 (ref 5.0–8.0)

## 2018-02-03 MED ORDER — DONEPEZIL HCL 10 MG PO TABS: 10.0000 mg | ORAL_TABLET | Freq: Every day | ORAL | 1 refills | Status: DC

## 2018-02-03 MED ORDER — ASPIRIN EC 81 MG PO TBEC: 81.0000 mg | DELAYED_RELEASE_TABLET | Freq: Every day | ORAL | 3 refills | Status: DC

## 2018-02-03 MED ORDER — PRAVASTATIN SODIUM 20 MG PO TABS: 20.0000 mg | ORAL_TABLET | Freq: Every day | ORAL | 1 refills | Status: DC

## 2018-02-03 MED ORDER — LISINOPRIL 20 MG PO TABS: 20.0000 mg | ORAL_TABLET | Freq: Every day | ORAL | 1 refills | Status: DC

## 2018-02-03 MED ORDER — AMLODIPINE BESYLATE 10 MG PO TABS: 10.0000 mg | ORAL_TABLET | Freq: Every day | ORAL | 1 refills | Status: DC

## 2018-02-03 MED FILL — LISINOPRIL 20 MG TAB: 20 | 30 days supply | Qty: 30 | Fill #0

## 2018-02-03 NOTE — Patient Instructions (Addendum)
Please discontinue Lasix 20 mg daily.  Continue Lisinopril 20 mg daily.  Will also continue Amlodipine 10 mg   - Continue medication, monitor blood pressure at home. Continue DASH diet. Reminder to go to the ER if any CP, SOB, nausea, dizziness, severe HA, changes vision/speech, left arm numbness and tingling and jaw pain.     DASH Eating Plan DASH stands for "Dietary Approaches to Stop Hypertension." The DASH eating plan is a healthy eating plan that has been shown to reduce high blood pressure (hypertension). It may also reduce your risk for type 2 diabetes, heart disease, and stroke. The DASH eating plan may also help with weight loss. What are tips for following this plan? General guidelines  Avoid eating more than 2,300 mg (milligrams) of salt (sodium) a day. If you have hypertension, you may need to reduce your sodium intake to 1,500 mg a day.  Limit alcohol intake to no more than 1 drink a day for nonpregnant women and 2 drinks a day for men. One drink equals 12 oz of beer, 5 oz of wine, or 1 oz of hard liquor.  Work with your health care provider to maintain a healthy body weight or to lose weight. Ask what an ideal weight is for you.  Get at least 30 minutes of exercise that causes your heart to beat faster (aerobic exercise) most days of the week. Activities may include walking, swimming, or biking.  Work with your health care provider or diet and nutrition specialist (dietitian) to adjust your eating plan to your individual calorie needs. Reading food labels  Check food labels for the amount of sodium per serving. Choose foods with less than 5 percent of the Daily Value of sodium. Generally, foods with less than 300 mg of sodium per serving fit into this eating plan.  To find whole grains, look for the word "whole" as the first word in the ingredient list. Shopping  Buy products labeled as "low-sodium" or "no salt added."  Buy fresh foods. Avoid canned foods and premade  or frozen meals. Cooking  Avoid adding salt when cooking. Use salt-free seasonings or herbs instead of table salt or sea salt. Check with your health care provider or pharmacist before using salt substitutes.  Do not fry foods. Cook foods using healthy methods such as baking, boiling, grilling, and broiling instead.  Cook with heart-healthy oils, such as olive, canola, soybean, or sunflower oil. Meal planning   Eat a balanced diet that includes: ? 5 or more servings of fruits and vegetables each day. At each meal, try to fill half of your plate with fruits and vegetables. ? Up to 6-8 servings of whole grains each day. ? Less than 6 oz of lean meat, poultry, or fish each day. A 3-oz serving of meat is about the same size as a deck of cards. One egg equals 1 oz. ? 2 servings of low-fat dairy each day. ? A serving of nuts, seeds, or beans 5 times each week. ? Heart-healthy fats. Healthy fats called Omega-3 fatty acids are found in foods such as flaxseeds and coldwater fish, like sardines, salmon, and mackerel.  Limit how much you eat of the following: ? Canned or prepackaged foods. ? Food that is high in trans fat, such as fried foods. ? Food that is high in saturated fat, such as fatty meat. ? Sweets, desserts, sugary drinks, and other foods with added sugar. ? Full-fat dairy products.  Do not salt foods before eating.  Try  to eat at least 2 vegetarian meals each week.  Eat more home-cooked food and less restaurant, buffet, and fast food.  When eating at a restaurant, ask that your food be prepared with less salt or no salt, if possible. What foods are recommended? The items listed may not be a complete list. Talk with your dietitian about what dietary choices are best for you. Grains Whole-grain or whole-wheat bread. Whole-grain or whole-wheat pasta. Brown rice. [place]. Bulgur. Whole-grain and low-sodium cereals. Pita bread. Low-fat, low-sodium crackers. Whole-wheat flour  tortillas. Vegetables Fresh or frozen vegetables (raw, steamed, roasted, or grilled). Low-sodium or reduced-sodium tomato and vegetable juice. Low-sodium or reduced-sodium tomato sauce and tomato paste. Low-sodium or reduced-sodium canned vegetables. Fruits All fresh, dried, or frozen fruit. Canned fruit in natural juice (without added sugar). Meat and other protein foods Skinless chicken or [place]. Ground chicken or [place]. Pork with fat trimmed off. Fish and seafood. Egg whites. Dried beans, peas, or lentils. Unsalted nuts, nut butters, and seeds. Unsalted canned beans. Lean cuts of beef with fat trimmed off. Low-sodium, lean deli meat. Dairy Low-fat (1%) or fat-free (skim) milk. Fat-free, low-fat, or reduced-fat cheeses. Nonfat, low-sodium ricotta or cottage cheese. Low-fat or nonfat yogurt. Low-fat, low-sodium cheese. Fats and oils Soft margarine without trans fats. Vegetable oil. Low-fat, reduced-fat, or light mayonnaise and salad dressings (reduced-sodium). Canola, safflower, olive, soybean, and sunflower oils. Avocado. Seasoning and other foods Herbs. Spices. Seasoning mixes without salt. Unsalted popcorn and pretzels. Fat-free sweets. What foods are not recommended? The items listed may not be a complete list. Talk with your dietitian about what dietary choices are best for you. Grains Baked goods made with fat, such as croissants, muffins, or some breads. Dry pasta or rice meal packs. Vegetables Creamed or fried vegetables. Vegetables in a cheese sauce. Regular canned vegetables (not low-sodium or reduced-sodium). Regular canned tomato sauce and paste (not low-sodium or reduced-sodium). Regular tomato and vegetable juice (not low-sodium or reduced-sodium). Angie Fava. Olives. Fruits Canned fruit in a light or heavy syrup. Fried fruit. Fruit in cream or butter sauce. Meat and other protein foods Fatty cuts of meat. Ribs. Fried meat. Berniece Salines. Sausage. Bologna and other processed lunch meats.  Salami. Fatback. Hotdogs. Bratwurst. Salted nuts and seeds. Canned beans with added salt. Canned or smoked fish. Whole eggs or egg yolks. Chicken or [place] with skin. Dairy Whole or 2% milk, cream, and half-and-half. Whole or full-fat cream cheese. Whole-fat or sweetened yogurt. Full-fat cheese. Nondairy creamers. Whipped toppings. Processed cheese and cheese spreads. Fats and oils Butter. Stick margarine. Lard. Shortening. Ghee. Bacon fat. Tropical oils, such as coconut, palm kernel, or palm oil. Seasoning and other foods Salted popcorn and pretzels. Onion salt, garlic salt, seasoned salt, table salt, and sea salt. Worcestershire sauce. Tartar sauce. Barbecue sauce. Teriyaki sauce. Soy sauce, including reduced-sodium. Steak sauce. Canned and packaged gravies. Fish sauce. Oyster sauce. Cocktail sauce. Horseradish that you find on the shelf. Ketchup. Mustard. Meat flavorings and tenderizers. Bouillon cubes. Hot sauce and Tabasco sauce. Premade or packaged marinades. Premade or packaged taco seasonings. Relishes. Regular salad dressings. Where to find more information:  National Heart, Lung, and [place]: https://wilson-eaton.com/  American Heart Association: www.heart.org Summary  The DASH eating plan is a healthy eating plan that has been shown to reduce high blood pressure (hypertension). It may also reduce your risk for type 2 diabetes, heart disease, and stroke.  With the DASH eating plan, you should limit salt (sodium) intake to 2,300 mg a day. If you  have hypertension, you may need to reduce your sodium intake to 1,500 mg a day.  When on the DASH eating plan, aim to eat more fresh fruits and vegetables, whole grains, lean proteins, low-fat dairy, and heart-healthy fats.  Work with your health care provider or diet and nutrition specialist (dietitian) to adjust your eating plan to your individual calorie needs. This information is not intended to replace advice given to you by your health  care provider. Make sure you discuss any questions you have with your health care provider. Document Released: 11/11/2011 Document Revised: 11/15/2016 Document Reviewed: 11/15/2016 Elsevier Interactive Patient Education  [employer].

## 2018-02-03 NOTE — Progress Notes (Signed)
Subjective:  Barry Owens, an 84 year old male with a history of hypertension presents accompanied by wife and daughter presents for a follow up of hypertension and dementia. Patient has been lost to follow up over the past year. He spends 3-6 months in [place] with his other children.  Patient has been taking medications consistently. His granddaughter is a Engineer, civil (consulting)nurse and checks blood pressure daily. Daughter says blood pressure has been within a normal range. [name]'s wife or daughter prepares most meals. He typically eats a balanced diet. Patient remains active daily.  Patient denies: chest pressure/discomfort, dyspnea, exertional chest pressure/discomfort, fatigue, irregular heart beat and lower extremity edema. Patient continues to take Aricept 10 mg consistently. He is mostly independent. His family continues to be concerned about memory.    Functional Assessment:  Activities of Daily Living (ADLs):   He is independent in the following: ambulation, bathing and hygiene, feeding, continence and toileting. Patient requires minimal assistance with ADLs. Family denies recent falls.   Past Medical History:  Diagnosis Date  . Cataract   . Hypertension   . Stroke [facility](HCC)    Social History   Socioeconomic History  . Marital status: Married    Spouse name: Not on file  . Number of children: Not on file  . Years of education: Not on file  . Highest education level: Not on file  Social Needs  . Financial resource strain: Not on file  . Food insecurity - worry: Not on file  . Food insecurity - inability: Not on file  . Transportation needs - medical: Not on file  . Transportation needs - non-medical: Not on file  Occupational History  . Not on file  Tobacco Use  . Smoking status: Former Smoker    Types: Cigarettes    Last attempt to quit: 04/30/1987    Years since quitting: 30.7  . Smokeless tobacco: Never Used  Substance and Sexual Activity  . Alcohol use: No  . Drug use: No  . Sexual  activity: Not on file  Other Topics Concern  . Not on file  Social History Narrative  . Not on file   Immunization History  Administered Date(s) Administered  . Influenza,inj,Quad PF,6+ Mos 08/17/2015, 09/13/2017  . Pneumococcal Conjugate-13 10/11/2016   Review of Systems  Constitutional: Negative.   HENT: Negative.   Eyes: Negative.   Respiratory: Negative.   Cardiovascular: Negative.   Gastrointestinal: Negative.   Genitourinary: Negative.   Musculoskeletal: Negative.   Skin: Negative.   Neurological: Negative for dizziness, tingling, tremors, weakness and headaches.  Endo/Heme/Allergies: Negative.      Objective:  Physical Exam  Constitutional: He is oriented to person, place, and time. He appears well-developed.  HENT:  Head: Normocephalic and atraumatic.  Eyes: Pupils are equal, round, and reactive to light.  Cardiovascular: Normal rate, regular rhythm and normal heart sounds.  Pulmonary/Chest: Effort normal and breath sounds normal.  Abdominal: Soft. Bowel sounds are normal.  Musculoskeletal: Normal range of motion.  Neurological: He is alert and oriented to person, place, and time.  Skin: Skin is warm and dry.  Psychiatric: He has a normal mood and affect. His behavior is normal.   Assessment:     BP (!) 122/48 (BP Location: Right Arm, Patient Position: Sitting, Cuff Size: Normal)   Pulse 61   Temp 97.9 F (36.6 C) (Oral)   Wt 211 lb (95.7 kg)   SpO2 100%   BMI 29.43 kg/m  Plan:  1. Essential hypertension Will continue amlodipine 10mg   daily and lisinopril 20 mg daily. Will discontinue furosemide 20 mg daily.  Will review renal functioning as results become available - Continue medication, monitor blood pressure at home. Continue DASH diet. Reminder to go to the ER if any CP, SOB, nausea, dizziness, severe HA, changes vision/speech, left arm numbness and tingling and jaw pain.    - Basic Metabolic Panel - amLODipine (NORVASC) 10 MG tablet; Take 1  tablet (10 mg total) by mouth daily.  Dispense: 90 tablet; Refill: 1 - lisinopril (PRINIVIL,ZESTRIL) 20 MG tablet; Take 1 tablet (20 mg total) by mouth daily.  Dispense: 90 tablet; Refill: 1 - pravastatin (PRAVACHOL) 20 MG tablet; Take 1 tablet (20 mg total) by mouth daily at 6 PM.  Dispense: 90 tablet; Refill: 1 - POCT urinalysis dip (device)  2. Dementia without behavioral disturbance, unspecified dementia type - donepezil (ARICEPT) 10 MG tablet; Take 1 tablet (10 mg total) by mouth at bedtime.  Dispense: 90 tablet; Refill: 1  3. History of stroke - aspirin EC 81 MG tablet; Take 1 tablet (81 mg total) by mouth daily. (Patient may obtain OTC)  Dispense: 100 tablet; Refill: 3   RTC: 3 months for hypertension   Nolon Nations  MSN, FNP-C Patient Care [facility] [address] [place], [place] [address] [phone]

## 2018-02-04 LAB — BASIC METABOLIC PANEL
BUN/Creatinine Ratio: 11 (ref 10–24)
BUN: 10 mg/dL (ref 8–27)
CO2: 23 mmol/L (ref 20–29)
Calcium: 8.9 mg/dL (ref 8.6–10.2)
Chloride: 105 mmol/L (ref 96–106)
Creatinine, Ser: 0.9 mg/dL (ref 0.76–1.27)
GFR calc Af Amer: 90 mL/min/{1.73_m2} (ref >59)
GFR calc non Af Amer: 78 mL/min/{1.73_m2} (ref >59)
GLUCOSE: 147 mg/dL — AB (ref 65–99)
POTASSIUM: 4 mmol/L (ref 3.5–5.2)
SODIUM: 143 mmol/L (ref 134–144)

## 2018-02-06 ENCOUNTER — Telehealth: Payer: Self-pay

## 2018-02-06 ENCOUNTER — Other Ambulatory Visit: Payer: Self-pay

## 2018-02-06 MED ORDER — LATANOPROST 0.005 % OP SOLN: OPHTHALMIC | 11 refills | Status: DC

## 2018-02-06 MED FILL — PRAVASTATIN NA 20 MG TAB: 20 | 30 days supply | Qty: 30 | Fill #0

## 2018-02-06 MED FILL — AMLODIPINE BESYLATE 10 MG T: 10 | 30 days supply | Qty: 30 | Fill #0

## 2018-02-06 MED FILL — DONEPEZIL HCL 10 MG TABLET: 10 | 30 days supply | Qty: 30 | Fill #0

## 2018-02-06 MED FILL — LATANOPROST 0.005% EYE DRP: 0.005 | 18 days supply | Qty: 3 | Fill #0

## 2018-02-06 NOTE — Telephone Encounter (Signed)
-----   Message from Massie MaroonLachina M Hollis, [place] sent at 02/04/2018  7:50 AM EST ----- Regarding: Lab results Please inform patient's daughter that all labs are within a normal range.  No further medication changes warranted on today.  Remind her of the importance of checking blood pressures at home.  Goal is for blood pressure to remain less than 150/90.  If blood pressure is consistently elevated, schedule an appointment.   Thanks

## 2018-02-06 NOTE — Telephone Encounter (Signed)
Called and spoke with patient's daughter. Advised that all labs are within a normal range and no changes were needed at this time for medications. Advised to check bp at home and the goal is to remain under 150/90. If bp is consistently elevated to schedule an appointment. Patients daughter stated understanding. Thanks!

## 2018-03-06 MED FILL — PRAVASTATIN NA 20 MG TAB: 20 | 30 days supply | Qty: 30 | Fill #1

## 2018-03-06 MED FILL — DONEPEZIL HCL 10 MG TABLET: 10 | 30 days supply | Qty: 30 | Fill #1

## 2018-03-06 MED FILL — AMLODIPINE BESYLATE 10 MG T: 10 | 30 days supply | Qty: 30 | Fill #1

## 2018-03-06 MED FILL — LISINOPRIL 20 MG TAB: 20 | 30 days supply | Qty: 30 | Fill #1

## 2018-03-15 ENCOUNTER — Ambulatory Visit: Payer: Self-pay | Attending: Family Medicine

## 2018-04-07 MED FILL — LISINOPRIL 20 MG TAB: 20 | 30 days supply | Qty: 30 | Fill #2

## 2018-04-07 MED FILL — AMLODIPINE BESYLATE 10 MG T: 10 | 30 days supply | Qty: 30 | Fill #2

## 2018-04-07 MED FILL — DONEPEZIL HCL 10 MG TABLET: 10 | 30 days supply | Qty: 30 | Fill #2

## 2018-04-07 MED FILL — PRAVASTATIN NA 20 MG TAB: 20 | 30 days supply | Qty: 30 | Fill #2

## 2018-05-08 ENCOUNTER — Encounter: Payer: Self-pay | Admitting: Family Medicine

## 2018-05-08 ENCOUNTER — Ambulatory Visit (INDEPENDENT_AMBULATORY_CARE_PROVIDER_SITE_OTHER): Payer: Self-pay | Admitting: Family Medicine

## 2018-05-08 VITALS — BP 138/56 | HR 64 | Temp 98.2°F | Resp 16 | Ht 71.0 in | Wt 208.0 lb

## 2018-05-08 DIAGNOSIS — Z23 Encounter for immunization: Secondary | ICD-10-CM

## 2018-05-08 DIAGNOSIS — I10 Essential (primary) hypertension: Secondary | ICD-10-CM

## 2018-05-08 DIAGNOSIS — F0390 Unspecified dementia without behavioral disturbance: Secondary | ICD-10-CM

## 2018-05-08 MED ORDER — DONEPEZIL HCL 10 MG PO TABS: 10.0000 mg | ORAL_TABLET | Freq: Every day | ORAL | 0 refills | Status: DC

## 2018-05-08 MED ORDER — AMLODIPINE BESYLATE 10 MG PO TABS: 10.0000 mg | ORAL_TABLET | Freq: Every day | ORAL | 1 refills | Status: DC

## 2018-05-08 MED ORDER — PRAVASTATIN SODIUM 20 MG PO TABS: 20.0000 mg | ORAL_TABLET | Freq: Every day | ORAL | 0 refills | Status: DC

## 2018-05-08 MED ORDER — LISINOPRIL 20 MG PO TABS: 20.0000 mg | ORAL_TABLET | Freq: Every day | ORAL | 0 refills | Status: DC

## 2018-05-08 MED FILL — AMLODIPINE BESYLATE 10 MG T: 10 | 90 days supply | Qty: 90 | Fill #0

## 2018-05-08 MED FILL — LISINOPRIL 20 MG TAB: 20 | 90 days supply | Qty: 90 | Fill #0

## 2018-05-08 MED FILL — PRAVASTATIN SODIUM 20 MG TA: 20 | 90 days supply | Qty: 90 | Fill #3

## 2018-05-08 MED FILL — DONEPEZIL HCL 10 MG TABLET: 10 | 90 days supply | Qty: 90 | Fill #0

## 2018-05-08 NOTE — Progress Notes (Signed)
Subjective:  Barry Owens, a very pleasant 84 year old male with a history of hypertension presents accompanied by wife and daughter for a follow up of chronic conditions. Patient has been taking medications consistently. His granddaughter is a Engineer, civil (consulting)nurse and checks blood pressure daily. Daughter says blood pressure has been within a normal range. Barry Owens's wife or daughter prepare most meals. He typically eats a balanced diet. Patient remains active daily.  Patient denies: chest pressure/discomfort, dyspnea, exertional chest pressure/discomfort, fatigue, irregular heart beat and lower extremity edema. Patient continues to take Aricept 10 mg consistently. He is mostly independent. His family continues to be concerned about memory.    Past Medical History:  Diagnosis Date  . Cataract   . Hypertension   . Stroke [facility](HCC)    Social History   Socioeconomic History  . Marital status: Married    Spouse name: Not on file  . Number of children: Not on file  . Years of education: Not on file  . Highest education level: Not on file  Occupational History  . Not on file  Social Needs  . Financial resource strain: Not on file  . Food insecurity:    Worry: Not on file    Inability: Not on file  . Transportation needs:    Medical: Not on file    Non-medical: Not on file  Tobacco Use  . Smoking status: Former Smoker    Types: Cigarettes    Last attempt to quit: 04/30/1987    Years since quitting: 31.0  . Smokeless tobacco: Never Used  Substance and Sexual Activity  . Alcohol use: No  . Drug use: No  . Sexual activity: Not on file  Lifestyle  . Physical activity:    Days per week: Not on file    Minutes per session: Not on file  . Stress: Not on file  Relationships  . Social connections:    Talks on phone: Not on file    Gets together: Not on file    Attends religious service: Not on file    Active member of club or organization: Not on file    Attends meetings of clubs or organizations:  Not on file    Relationship status: Not on file  . Intimate partner violence:    Fear of current or ex partner: Not on file    Emotionally abused: Not on file    Physically abused: Not on file    Forced sexual activity: Not on file  Other Topics Concern  . Not on file  Social History Narrative  . Not on file   Immunization History  Administered Date(s) Administered  . Influenza,inj,Quad PF,6+ Mos 08/17/2015, 09/13/2017  . Pneumococcal Conjugate-13 10/11/2016   Review of Systems  Constitutional: Negative.   HENT: Negative.   Eyes: Negative.   Respiratory: Negative.  Negative for hemoptysis and sputum production.   Cardiovascular: Negative.   Gastrointestinal: Negative.   Genitourinary: Negative.   Musculoskeletal: Negative.  Negative for back pain and neck pain.  Skin: Negative.   Neurological: Negative for dizziness, tingling, tremors, weakness and headaches.  Endo/Heme/Allergies: Negative.   Psychiatric/Behavioral: Negative.  Negative for depression, substance abuse and suicidal ideas.     Objective:  Physical Exam  Constitutional: He is oriented to person, place, and time. He appears well-developed.  HENT:  Head: Normocephalic and atraumatic.  Eyes: Pupils are equal, round, and reactive to light.  Cardiovascular: Normal rate, regular rhythm and normal heart sounds.  Pulmonary/Chest: Effort normal and breath sounds  normal.  Abdominal: Soft. Bowel sounds are normal.  Musculoskeletal: Normal range of motion.  Neurological: He is alert and oriented to person, place, and time.  Skin: Skin is warm and dry.  Psychiatric: He has a normal mood and affect. His behavior is normal.   Assessment:     BP (!) 138/56 (BP Location: Left Arm, Patient Position: Sitting, Cuff Size: Normal) Comment: manually  Pulse 64   Temp 98.2 F (36.8 C) (Oral)   Resp 16   Ht 5\' 11"  (1.803 m)   Wt 208 lb (94.3 kg)   SpO2 100%   BMI 29.01 kg/m  Plan:  1. Essential hypertension Blood  pressure is at goal on current medication regimen. No medication changes warranted on today.   - amLODipine (NORVASC) 10 MG tablet; Take 1 tablet (10 mg total) by mouth daily.  Dispense: 180 tablet; Refill: 1 - lisinopril (PRINIVIL,ZESTRIL) 20 MG tablet; Take 1 tablet (20 mg total) by mouth daily.  Dispense: 180 tablet; Refill: 0 - pravastatin (PRAVACHOL) 20 MG tablet; Take 1 tablet (20 mg total) by mouth daily at 6 PM.  Dispense: 180 tablet; Refill: 0 - Basic Metabolic Panel  2. Dementia without behavioral disturbance, unspecified dementia type - donepezil (ARICEPT) 10 MG tablet; Take 1 tablet (10 mg total) by mouth at bedtime.  Dispense: 180 tablet; Refill: 0  3. Immunization due - Pneumococcal polysaccharide vaccine 23-valent greater than or equal to 2yo subcutaneous/IM   RTC: Patient will be returning to [place] for 6 months. Schedule follow up for hypertension upon return.   Nolon Nations  MSN, FNP-C Patient Care [facility] [address] [place], [place] [address] [phone]

## 2018-05-08 NOTE — Patient Instructions (Signed)
Have a great stay in [place]. Please return for follow up in 6 months.    Your blood pressure is at goal on today. No medication changes warranted on today. - Continue medication, monitor blood pressure at home. Continue DASH diet. Reminder to go to the ER if any CP, SOB, nausea, dizziness, severe HA, changes vision/speech, left arm numbness and tingling and jaw pain.

## 2018-05-09 LAB — BASIC METABOLIC PANEL
BUN/Creatinine Ratio: 9 — ABNORMAL LOW (ref 10–24)
BUN: 8 mg/dL (ref 8–27)
CO2: 23 mmol/L (ref 20–29)
CREATININE: 0.91 mg/dL (ref 0.76–1.27)
Calcium: 8.7 mg/dL (ref 8.6–10.2)
Chloride: 107 mmol/L — ABNORMAL HIGH (ref 96–106)
GFR calc Af Amer: 89 mL/min/{1.73_m2} (ref >59)
GFR calc non Af Amer: 77 mL/min/{1.73_m2} (ref >59)
GLUCOSE: 112 mg/dL — AB (ref 65–99)
POTASSIUM: 4.1 mmol/L (ref 3.5–5.2)
SODIUM: 144 mmol/L (ref 134–144)

## 2018-10-03 MED FILL — DONEPEZIL HCL 10 MG TABLET: 10 | 30 days supply | Qty: 30 | Fill #3

## 2018-10-03 MED FILL — PRAVASTATIN SODIUM 20 MG TA: 20 | 90 days supply | Qty: 90 | Fill #4

## 2018-10-03 MED FILL — LISINOPRIL 20 MG TAB: 20 | 30 days supply | Qty: 30 | Fill #3

## 2018-10-03 MED FILL — AMLODIPINE BESYLATE 10 MG T: 10 | 30 days supply | Qty: 30 | Fill #3

## 2018-11-17 MED FILL — DONEPEZIL HCL 10 MG TABLET: 10 | 30 days supply | Qty: 30 | Fill #4

## 2018-11-17 MED FILL — LISINOPRIL 20 MG TAB: 20 | 30 days supply | Qty: 30 | Fill #4

## 2018-11-17 MED FILL — AMLODIPINE BESYLATE 10 MG T: 10 | 30 days supply | Qty: 30 | Fill #4

## 2018-11-22 ENCOUNTER — Encounter: Payer: Self-pay | Admitting: Family Medicine

## 2018-11-22 ENCOUNTER — Ambulatory Visit (INDEPENDENT_AMBULATORY_CARE_PROVIDER_SITE_OTHER): Payer: Self-pay | Admitting: Family Medicine

## 2018-11-22 VITALS — BP 149/59 | HR 65 | Temp 97.6°F | Resp 14 | Ht 71.0 in | Wt 204.0 lb

## 2018-11-22 DIAGNOSIS — Z8673 Personal history of transient ischemic attack (TIA), and cerebral infarction without residual deficits: Secondary | ICD-10-CM

## 2018-11-22 DIAGNOSIS — F0390 Unspecified dementia without behavioral disturbance: Secondary | ICD-10-CM

## 2018-11-22 DIAGNOSIS — L723 Sebaceous cyst: Secondary | ICD-10-CM

## 2018-11-22 DIAGNOSIS — I10 Essential (primary) hypertension: Secondary | ICD-10-CM

## 2018-11-22 DIAGNOSIS — Z23 Encounter for immunization: Secondary | ICD-10-CM

## 2018-11-22 LAB — POCT URINALYSIS DIPSTICK
Bilirubin, UA: NEGATIVE
Blood, UA: NEGATIVE
Glucose, UA: NEGATIVE
Ketones, UA: NEGATIVE
Leukocytes, UA: NEGATIVE
Nitrite, UA: NEGATIVE
Protein, UA: NEGATIVE
Spec Grav, UA: <=1.005 — AB (ref 1.010–1.025)
Urobilinogen, UA: 0.2 E.U./dL
pH, UA: 5 (ref 5.0–8.0)

## 2018-11-22 MED ORDER — LISINOPRIL 20 MG PO TABS: 20.0000 mg | ORAL_TABLET | Freq: Every day | ORAL | 3 refills | Status: DC

## 2018-11-22 MED ORDER — LATANOPROST 0.005 % OP SOLN: OPHTHALMIC | 11 refills | Status: DC

## 2018-11-22 MED ORDER — DONEPEZIL HCL 10 MG PO TABS: 10.0000 mg | ORAL_TABLET | Freq: Every day | ORAL | 3 refills | Status: DC

## 2018-11-22 MED ORDER — AMLODIPINE BESYLATE 10 MG PO TABS: 10.0000 mg | ORAL_TABLET | Freq: Every day | ORAL | 3 refills | Status: DC

## 2018-11-22 MED ORDER — PRAVASTATIN SODIUM 20 MG PO TABS: 20.0000 mg | ORAL_TABLET | Freq: Every day | ORAL | 3 refills | Status: DC

## 2018-11-22 MED FILL — LATANOPROST 0.005% EYE DRP: 0.005 | 18 days supply | Qty: 3 | Fill #0

## 2018-11-22 NOTE — Patient Instructions (Addendum)
It was a pleasure meeting you and your family. I look forward to working with you.   Hypertension Hypertension is another name for high blood pressure. High blood pressure forces your heart to work harder to pump blood. This can cause problems over time. There are two numbers in a blood pressure reading. There is a top number (systolic) over a bottom number (diastolic). It is best to have a blood pressure below 120/80. Healthy choices can help lower your blood pressure. You may need medicine to help lower your blood pressure if:  Your blood pressure cannot be lowered with healthy choices.  Your blood pressure is higher than 130/80. Follow these instructions at home: Eating and drinking   If directed, follow the DASH eating plan. This diet includes: ? Filling half of your plate at each meal with fruits and vegetables. ? Filling one quarter of your plate at each meal with whole grains. Whole grains include whole wheat pasta, brown rice, and whole grain bread. ? Eating or drinking low-fat dairy products, such as skim milk or low-fat yogurt. ? Filling one quarter of your plate at each meal with low-fat (lean) proteins. Low-fat proteins include fish, skinless chicken, eggs, beans, and tofu. ? Avoiding fatty meat, cured and processed meat, or chicken with skin. ? Avoiding premade or processed food.  Eat less than 1,500 mg of salt (sodium) a day.  Limit alcohol use to no more than 1 drink a day for nonpregnant women and 2 drinks a day for men. One drink equals 12 oz of beer, 5 oz of wine, or 1 oz of hard liquor. Lifestyle  Work with your doctor to stay at a healthy weight or to lose weight. Ask your doctor what the best weight is for you.  Get at least 30 minutes of exercise that causes your heart to beat faster (aerobic exercise) most days of the week. This may include walking, swimming, or biking.  Get at least 30 minutes of exercise that strengthens your muscles (resistance exercise) at  least 3 days a week. This may include lifting weights or pilates.  Do not use any products that contain nicotine or tobacco. This includes cigarettes and e-cigarettes. If you need help quitting, ask your doctor.  Check your blood pressure at home as told by your doctor.  Keep all follow-up visits as told by your doctor. This is important. Medicines  Take over-the-counter and prescription medicines only as told by your doctor. Follow directions carefully.  Do not skip doses of blood pressure medicine. The medicine does not work as well if you skip doses. Skipping doses also puts you at risk for problems.  Ask your doctor about side effects or reactions to medicines that you should watch for. Contact a doctor if:  You think you are having a reaction to the medicine you are taking.  You have headaches that keep coming back (recurring).  You feel dizzy.  You have swelling in your ankles.  You have trouble with your vision. Get help right away if:  You get a very bad headache.  You start to feel confused.  You feel weak or numb.  You feel faint.  You get very bad pain in your: ? Chest. ? Belly (abdomen).  You throw up (vomit) more than once.  You have trouble breathing. Summary  Hypertension is another name for high blood pressure.  Making healthy choices can help lower blood pressure. If your blood pressure cannot be controlled with healthy choices, you may need  to take medicine. This information is not intended to replace advice given to you by your health care provider. Make sure you discuss any questions you have with your health care provider. Document Released: 05/10/2008 Document Revised: 10/20/2016 Document Reviewed: 10/20/2016 Elsevier Interactive Patient Education  2019 [employer].

## 2018-11-22 NOTE — Progress Notes (Signed)
[facility]   Progress Note: General Provider: Mike Gip, FNP  SUBJECTIVE:   Barry Owens is a 84 y.o. male who  has a past medical history of Cataract, Hypertension, and Stroke (HCC).. Patient presents today for Hypertension  Patient presents accompanied by wife and daughter for follow-up of chronic conditions.  Patient has been out of his medication for the past 2 days, but usually takes medications consistently.  Patient recently returned from [place] after a 58-month visit.  His family reports that he is doing well and does not have side effects from medications. He has a raised lesion noted to the left side of his back that has been present for several years.  The wife states that it has gotten larger.  Patient was evaluated in the past and did not want anything ordered for this.  He is still contemplating whether or not he would like to have it removed.  Patient states that it does not cause pain. Reports mild discomfort with laying on the area.   Review of Systems  Constitutional: Negative.   HENT: Negative.   Eyes: Negative.   Respiratory: Negative.   Cardiovascular: Negative.   Gastrointestinal: Negative.   Genitourinary: Negative.   Musculoskeletal: Negative.   Skin: Negative.        Raised lesion noted to the left thoracic area.   Neurological: Negative.   Psychiatric/Behavioral: Negative.      OBJECTIVE: BP (!) 149/59 (BP Location: Left Arm, Patient Position: Sitting, Cuff Size: Large)   Pulse 65   Temp 97.6 F (36.4 C) (Oral)   Resp 14   Ht 5\' 11"  (1.803 m)   Wt 204 lb (92.5 kg)   SpO2 100%   BMI 28.45 kg/m   Wt Readings from Last 3 Encounters:  11/22/18 204 lb (92.5 kg)  05/08/18 208 lb (94.3 kg)  02/03/18 211 lb (95.7 kg)     Physical Exam Vitals signs and nursing note reviewed.  Constitutional:      General: He is not in acute distress.    Appearance: He is well-developed.  HENT:     Head:  Normocephalic and atraumatic.  Eyes:     Conjunctiva/sclera: Conjunctivae normal.     Pupils: Pupils are equal, round, and reactive to light.  Neck:     Musculoskeletal: Normal range of motion.  Cardiovascular:     Rate and Rhythm: Normal rate and regular rhythm.     Heart sounds: Normal heart sounds.  Pulmonary:     Effort: Pulmonary effort is normal. No respiratory distress.     Breath sounds: Normal breath sounds.  Abdominal:     General: Bowel sounds are normal. There is no distension.     Palpations: Abdomen is soft.  Musculoskeletal: Normal range of motion.       Arms:  Skin:    General: Skin is warm and dry.  Neurological:     Mental Status: He is alert and oriented to person, place, and time.  Psychiatric:        Behavior: Behavior normal.        Thought Content: Thought content normal.     ASSESSMENT/PLAN: 1. Essential hypertension - Urinalysis Dipstick - amLODipine (NORVASC) 10 MG tablet; Take 1 tablet (10 mg total) by mouth daily.  Dispense: 90 tablet; Refill: 3 - lisinopril (PRINIVIL,ZESTRIL) 20 MG tablet; Take 1 tablet (20 mg total) by mouth daily.  Dispense: 90 tablet; Refill: 3 - pravastatin (PRAVACHOL) 20 MG tablet;  Take 1 tablet (20 mg total) by mouth daily at 6 PM.  Dispense: 90 tablet; Refill: 3  2. Dementia without behavioral disturbance, unspecified dementia type (HCC) - donepezil (ARICEPT) 10 MG tablet; Take 1 tablet (10 mg total) by mouth at bedtime.  Dispense: 90 tablet; Refill: 3   3. History of stroke Ambulating well.  No medication changes warranted at the present time. 4. Sebaceous cyst Will continue to monitor. Family needs to reapply for orange card and [employer] discount.       Return in about 3 months (around 02/21/2019) for htn.        The patient was given clear instructions to go to ER or return to medical center if symptoms do not improve, worsen or new problems develop. The patient verbalized understanding and agreed with plan of  care.   Ms. Freda Jacksonndr L. Riley Lamouglas, FNP-BC [facility] [address]  [place], [place] [id number] [phone]

## 2018-12-18 MED FILL — LISINOPRIL 20 MG TAB: 20 | 30 days supply | Qty: 30 | Fill #5

## 2018-12-18 MED FILL — DONEPEZIL HCL 10 MG TABLET: 10 | 30 days supply | Qty: 30 | Fill #5

## 2018-12-18 MED FILL — LATANOPROST 0.005% EYE DRP: 0.005 | 18 days supply | Qty: 3 | Fill #1

## 2018-12-18 MED FILL — AMLODIPINE BESYLATE 10 MG T: 10 | 30 days supply | Qty: 30 | Fill #5

## 2018-12-18 MED FILL — PRAVASTATIN SODIUM 20 MG TA: 20 | 90 days supply | Qty: 90 | Fill #5

## 2019-01-22 MED FILL — LATANOPROST 0.005% EYE DRP: 0.005 | 18 days supply | Qty: 3 | Fill #2

## 2019-01-24 ENCOUNTER — Telehealth: Payer: Self-pay

## 2019-01-24 DIAGNOSIS — Z8673 Personal history of transient ischemic attack (TIA), and cerebral infarction without residual deficits: Secondary | ICD-10-CM

## 2019-01-24 DIAGNOSIS — F0390 Unspecified dementia without behavioral disturbance: Secondary | ICD-10-CM

## 2019-01-24 DIAGNOSIS — I10 Essential (primary) hypertension: Secondary | ICD-10-CM

## 2019-01-24 MED ORDER — LATANOPROST 0.005 % OP SOLN: OPHTHALMIC | 0 refills | Status: AC

## 2019-01-24 MED ORDER — LISINOPRIL 20 MG PO TABS: 20.0000 mg | ORAL_TABLET | Freq: Every day | ORAL | 0 refills | Status: AC

## 2019-01-24 MED ORDER — PRAVASTATIN SODIUM 20 MG PO TABS: 20.0000 mg | ORAL_TABLET | Freq: Every day | ORAL | 0 refills | Status: AC

## 2019-01-24 MED ORDER — AMLODIPINE BESYLATE 10 MG PO TABS: 10.0000 mg | ORAL_TABLET | Freq: Every day | ORAL | 0 refills | Status: AC

## 2019-01-24 MED ORDER — DONEPEZIL HCL 10 MG PO TABS: 10.0000 mg | ORAL_TABLET | Freq: Every day | ORAL | 0 refills | Status: AC

## 2019-01-24 MED ORDER — ASPIRIN EC 81 MG PO TBEC: 81.0000 mg | DELAYED_RELEASE_TABLET | Freq: Every day | ORAL | 0 refills | Status: AC

## 2019-01-24 MED FILL — LISINOPRIL 20 MG TAB: 20 | 180 days supply | Qty: 180 | Fill #0

## 2019-01-24 MED FILL — DONEPEZIL HCL 10 MG TABLET: 10 | 180 days supply | Qty: 180 | Fill #0

## 2019-01-24 MED FILL — AMLODIPINE BESYLATE 10 MG T: 10 | 180 days supply | Qty: 180 | Fill #0

## 2019-01-24 MED FILL — PRAVASTATIN SODIUM 20 MG TA: 20 | 180 days supply | Qty: 180 | Fill #0

## 2019-01-24 NOTE — Telephone Encounter (Signed)
Spoke with patient's daughter Irving Burton) he is leaving the country and asked that medications be sent in for 6 months to community [facility] wellness. This has been sent in. Thanks!

## 2019-02-02 ENCOUNTER — Ambulatory Visit: Payer: Self-pay | Admitting: Family Medicine

## 2019-06-29 MED FILL — DONEPEZIL HCL 10 MG TABLET: 10 | 180 days supply | Qty: 180 | Fill #0

## 2019-06-29 MED FILL — LISINOPRIL 20 MG TABLET: 20 | 180 days supply | Qty: 180 | Fill #0

## 2019-06-29 MED FILL — AMLODIPINE BESYLATE 10 MG T: 10 | 180 days supply | Qty: 180 | Fill #0

## 2019-06-29 MED FILL — PRAVASTATIN SODIUM 20 MG TA: 20 | 180 days supply | Qty: 180 | Fill #0

## 2019-08-15 ENCOUNTER — Encounter (HOSPITAL_COMMUNITY): Payer: Self-pay

## 2019-08-15 ENCOUNTER — Encounter (HOSPITAL_COMMUNITY): Payer: Self-pay | Admitting: *Deleted

## 2020-01-21 ENCOUNTER — Other Ambulatory Visit: Payer: Self-pay | Admitting: Family Medicine

## 2020-01-21 DIAGNOSIS — F0390 Unspecified dementia without behavioral disturbance: Secondary | ICD-10-CM

## 2020-01-21 DIAGNOSIS — I10 Essential (primary) hypertension: Secondary | ICD-10-CM
